# Patient Record
Sex: Male | Born: 1935 | Race: White | Hispanic: No | Marital: Married | Smoking: Former smoker
Health system: Southern US, Community
[De-identification: ages and names within clinical notes are randomized; demographics above are authoritative.]

## PROBLEM LIST (undated history)

## (undated) DIAGNOSIS — N200 Calculus of kidney: Secondary | ICD-10-CM

## (undated) DIAGNOSIS — R011 Cardiac murmur, unspecified: Secondary | ICD-10-CM

## (undated) DIAGNOSIS — C44311 Basal cell carcinoma of skin of nose: Secondary | ICD-10-CM

## (undated) DIAGNOSIS — T782XXA Anaphylactic shock, unspecified, initial encounter: Secondary | ICD-10-CM

## (undated) DIAGNOSIS — E7800 Pure hypercholesterolemia, unspecified: Secondary | ICD-10-CM

## (undated) DIAGNOSIS — E119 Type 2 diabetes mellitus without complications: Secondary | ICD-10-CM

## (undated) DIAGNOSIS — M1990 Unspecified osteoarthritis, unspecified site: Secondary | ICD-10-CM

## (undated) DIAGNOSIS — I10 Essential (primary) hypertension: Secondary | ICD-10-CM

## (undated) HISTORY — PX: SKIN CANCER EXCISION: SHX779

## (undated) HISTORY — PX: BACK SURGERY: SHX140

---

## 1983-05-08 HISTORY — PX: LUMBAR DISC SURGERY: SHX700

## 2001-05-07 HISTORY — PX: COLONOSCOPY: SHX174

## 2001-08-23 ENCOUNTER — Encounter (INDEPENDENT_AMBULATORY_CARE_PROVIDER_SITE_OTHER): Payer: Self-pay | Admitting: *Deleted

## 2001-08-23 ENCOUNTER — Inpatient Hospital Stay (HOSPITAL_COMMUNITY): Admission: EM | Admit: 2001-08-23 | Discharge: 2001-08-26 | Payer: Self-pay | Admitting: Emergency Medicine

## 2001-08-23 ENCOUNTER — Encounter: Payer: Self-pay | Admitting: Emergency Medicine

## 2001-08-25 ENCOUNTER — Encounter: Payer: Self-pay | Admitting: Family Medicine

## 2001-08-28 ENCOUNTER — Encounter: Admission: RE | Admit: 2001-08-28 | Discharge: 2001-08-28 | Payer: Self-pay | Admitting: Family Medicine

## 2001-10-31 ENCOUNTER — Ambulatory Visit (HOSPITAL_COMMUNITY): Admission: RE | Admit: 2001-10-31 | Discharge: 2001-10-31 | Payer: Self-pay | Admitting: Gastroenterology

## 2001-10-31 ENCOUNTER — Encounter (INDEPENDENT_AMBULATORY_CARE_PROVIDER_SITE_OTHER): Payer: Self-pay | Admitting: Specialist

## 2007-12-04 ENCOUNTER — Inpatient Hospital Stay (HOSPITAL_COMMUNITY): Admission: EM | Admit: 2007-12-04 | Discharge: 2007-12-07 | Payer: Self-pay | Admitting: *Deleted

## 2007-12-05 ENCOUNTER — Encounter (INDEPENDENT_AMBULATORY_CARE_PROVIDER_SITE_OTHER): Payer: Self-pay | Admitting: Internal Medicine

## 2010-09-19 NOTE — Discharge Summary (Signed)
Bradley Simmons, BIRCHER NO.:  [id number]   MEDICAL RECORD NO.:  [id number]          PATIENT TYPE:  INP   LOCATION:  4739                         FACILITY:  [facility]   PHYSICIAN:  Elliot Cousin, M.D.    DATE OF BIRTH:  Aug 27, 1929   DATE OF ADMISSION:  12/04/2007  DATE OF DISCHARGE:  12/07/2007                               DISCHARGE SUMMARY   DISCHARGE DIAGNOSES:  1. Fever secondary to Escherichia coli bacteremia.  Source unknown.  2. Mild transient antibiotic-induced diarrhea.  3. Chronic thrombocytopenia dating back to 2003, presumed to be      secondary to alcohol use.  4. Macrocytosis secondary to alcohol use.  5. Hepatic transaminitis, thought to be secondary to alcohol use.      Ultrasound of the abdomen essentially negative for acute findings.  6. Alcohol use/abuse.  The patient stated that he had decreased his      alcohol intake several days prior to this hospitalization.  He      plans to stop completely.  7. Hypertension.  8. Type 2 diabetes mellitus.  9. Hypokalemia.   DISCHARGE MEDICATIONS:  1. Cipro 500 mg b.i.d. for 12 more days.  2. Multivitamin once daily.  3. Zetia 10 mg daily.  4. Aspirin 81 mg daily.  5. Moexipril 15 mg daily.  6. Glipizide 5 mg daily (do not take if your capillary blood glucose      is less than 120 over the next few days).   DISCHARGE DISPOSITION:  The patient is currently stable and in improved  condition.  He is being discharged today.  He was advised to follow up  with Dr. Christell Constant in 3-5 days or as needed.   PROCEDURE PERFORMED:  1. 2-D echocardiogram.  Official final report/results are pending.      The preliminary results revealed no obvious gross vegetations.  2. Chest X-Ray on December 05, 2007.  The results revealed no acute      abnormalities.  3. Ultrasound of the abdomen on December 05, 2007.  The results revealed      no acute findings.  Normal gallbladder.  Possible fatty      infiltration of the liver.  Bilateral  renal cysts.   HISTORY OF PRESENT ILLNESS:  The patient is a 77 year old man with a  past medical history significant for hypertension and diabetes mellitus.  He was transferred to [facility] from [facility]  Emergency Department for further evaluation of fever and gram-negative  rod bacteremia.  Apparently, the patient presented to his primary care  physician Dr. Christell Constant several days prior to admission.  The patient's  temperature was found to be 102.3 in Dr. Kathi Der office.  Blood work was  obtained, and the patient was found to have thrombocytopenia.  Because  of the thrombocytopenia, doxycycline was started as the initial  treatment.  In addition, Dr. Christell Constant ordered blood cultures.  He was  called later by the lab with a report of positive blood cultures for  gram-negative rods.  The patient was, therefore, advised to go to the  emergency department  at [facility].  The patient was transferred  to [facility] as the physician at [facility] reported  that there was no infectious diseases specialist there at that time.   For additional details, please see the dictated history and physical.   HOSPITAL COURSE:  1. E-COLI BACTEREMIA.  At the time of the initial assessment at [facility], the patient was afebrile and hemodynamically stable.      His white blood cell count was 11.5.  During the evaluation at      [facility], the patient was started on Zosyn, vancomycin,      and Zithromax.  A number of laboratory studies were ordered by the      physician at [facility] including an HIV screen, which was      negative and blood cultures which have been negative so far.  His      urinalysis was negative as well.  [facility] spotted fever IgM      and IgG, as well as Lyme disease antibodies were ordered by the      physician at [facility].  However, the results were pending      when the patient was transferred to [facility].  During      this hospital course, the patient had no complaints of chest pain,      shortness of breath, abdominal pain, nausea, vomiting, diarrhea,      painful urination, or any upper respiratory infection symptoms.      Blood cultures were reordered, as well as an urinalysis and chest x-      ray.  The blood cultures have remained negative so far.  His      urinalysis revealed no signs of infection.  His chest x-ray was      completely clear.  The patient was maintained on vancomycin and      Zosyn.  Doxycycline was restarted.  Two days following intravenous      antibiotic treatment, the patient developed loose stools, however,      he had no diarrhea prior to the hospitalization.  Once the      intravenous antibiotics were discontinued, the patient's diarrhea      resolved.  Stool studies were obtained, nevertheless, and so far      they have been negative for C. diff and signs of routine bacterial      infection.  The patient is currently afebrile and has been afebrile      throughout the hospitalization.  His white blood cell count is now      within normal limits at 9.6.  The dictating physician called      [facility] to clarify and confirm whether the gram-negative rod was      growing in his blood.  [facility] confirmed that the bacteria was E-      coli, which was essentially pansensitive.  Therefore, vancomycin,      doxycycline, and Zosyn were discontinued.  The patient was      subsequently started on ciprofloxacin.  I discussed treatment      options with infectious disease's physician, Dr. Daiva Eves after he      was given the patient's clinical scenario.  Dr. Daiva Eves recommended      ciprofloxacin for continued treatment.  Total antibiotic course      should be 14 days.  The patient has received 2-1/2 days of  intravenous antibiotics, and he will be discharged to home on Cipro      for 12 more days.  The blood cultures ordered during this      hospitalization  are currently negative.  However, the final report      is pending.  2. CHRONIC THROMBOCYTOPENIA, MACROCYTOSIS, HEPATIC TRANSAMINITIS,      THOUGHT TO BE SECONDARY TO ALCOHOL USE.  The patient has a history      of chronic thrombocytopenia dating back to 2003, thought to be      secondary to alcohol use/abuse.  His platelet count was 51 at the      time of the initial hospital assessment.  In addition, the patient      had evidence of macrocytosis with an MCV of 104.7.  His total      bilirubin was 3.0, SGOT was 139, and SGPT was 85 at the time of the      initial hospital assessment.  The patient had no complaints of      abdominal pain, nausea, or vomiting.  An ultrasound of the abdomen      was ordered and revealed no acute findings, although there was      possibly mild fatty infiltration of the liver.  Upon my discussion      with the patient, he readily admitted to drinking approximately one      six-pack of beer daily.  However, he had already cut down to 1-2      beers several days prior to his hospital admission.  The patient      stated that he will stop drinking and that he needed no further      help with stopping.  Vitamin B12, TSH, and folate levels were      ordered for further evaluation.  His vitamin B12 was within normal      limits at 589.  His TSH was within normal limits at 4.4.  And his      folate was within normal limits at 8.5.  His followup bilirubin      improved to 1.8, his SGOT improved to 72, and his SGPT improved to      66.  3. HYPERTENSION.  The patient's blood pressure was well controlled      during the hospitalization.  4. TYPE 2 DIABETES MELLITUS.  The patient's diabetes was well      controlled during the hospitalization.  His      hemoglobin A1c was 6.4.  5. HYPOKALEMIA.  The patient's potassium was borderline low at 3.5.      He was repleted with potassium chloride during the hospitalization.      Elliot Cousin, M.D.  Electronically  Signed     DF/MEDQ  D:  12/07/2007  T:  12/08/2007  Job:  [id number]   cc:   Ernestina Penna, M.D.

## 2010-09-19 NOTE — H&P (Signed)
NAMEVERGIL, BURBY                 ACCOUNT NO.:  [id number]   MEDICAL RECORD NO.:  [id number]          PATIENT TYPE:  INP   LOCATION:  4739                         FACILITY:  [facility]   PHYSICIAN:  Mobolaji B. Bakare, M.D.DATE OF BIRTH:  Nov 30, 1929   DATE OF ADMISSION:  12/04/2007  DATE OF DISCHARGE:                              HISTORY & PHYSICAL   PRIMARY CARE PHYSICIAN:  Ernestina Penna, M.D.   CHIEF COMPLAINT:  Fever and chills.   HISTORY OF PRESENTING COMPLAINT:  Mr. Batten is a pleasant 77 year old  Caucasian male with history of diabetes mellitus, hypertension and  hyperlipidemia; who was in his usual state of health until 3 days ago,  when he developed fever associated with chills late in the afternoon.  He went to see Dr. Christell Constant and the patient was felt to be dehydrated.  He  was given some IV fluid and he felt better and was sent home.  About the  same time in the late afternoon the next day he developed fever and  chills.  Temperature on July 27 in Dr. Kathi Der office was 102.3.  He was  given IV fluids and some blood work were drawn at that time.  The next  day the patient had a similar episode of fever and chills.  Again, he  was seen by Dr. Kathi Der office.  He was started on doxycycline 100 mg  b.i.d. for presumed [facility] spotted fever; serologies were also  taken.   Again today the patient had similar episode.  He went back to Dr.  Kathi Der office.  His blood pressures were actually elevated.  He was  slightly tachycardic.  He had a fever of 100.1 with chills.  His blood  culture which was drawn on December 03, 2007 came back showing gram-negative  rods.  The patient was therefore sent to [facility].  The patient  was transferred from [facility] to [facility], due to lack of  infectious disease consultation over the weekend.   The patient denies any cough.  He has no shortness of breath.  There is  no sore throat, body aches, and no headaches.  He denies  dysuria or  straining at micturition.  No foul-smelling urine.  He denies diarrhea.  He had an episode of nausea today associated with vomiting of one  occasion.  He had an episode of nausea and vomiting yesterday, but no  diarrhea and no abdominal pain.  Laboratory data done at [facility] revealed leukopenia, with absolute neutrophil count of 1.6 and  thrombocytopenia of 56.  The patient had no bleeding from his gums or  any notable bruises.  He has received vancomycin, Zosyn and Zithromax at  [facility] prior to transfer.   REVIEW OF SYSTEMS:  As in the HPI.  He denies changes in mental status.  No neck stiffness.  No chest pain.  He has no noticeable rash.  The  patient tells me that he noted a bite about 3 weeks ago, which left him  with some red spots on his left leg -- but this has  since disappeared.  The patient has no history of sick contact.  No recent travels.  He has  no pets at home.  He lives with his wife.   PAST MEDICAL HISTORY:  1. Hypertension.  2. Hyperlipidemia.  3. Diabetes mellitus.  4. History of colon polyp, status post colonoscopy and polypectomy      June 2003 by Dr. Matthias Hughs.  5. History of alcohol abuse, with low platelets in 2003.  6. History of GI bleed in 2003.   PAST SURGICAL HISTORY:  Back surgery.   CURRENT MEDICATIONS:  The patient cannot remember names of his  medications, but  he knows he takes a tablet for high blood pressure,  cholesterol and glipizide for diabetes.   ALLERGIES:  NO KNOWN DRUG ALLERGIES.   FAMILY HISTORY:  Significant for father who passed away at the age of 64  for myocardial infarction.  Mother had some sinus cancer.   SOCIAL HISTORY:  He is retired from an [employer] as a Pensions consultant.  He quit smoking several years ago.  He  occasionally drinks alcohol; tells me he drinks 1-2 beers per night and  occasionally drinks vodka, but denies any withdrawal symptoms.  He is  active and  independent of activities of daily living.   PHYSICAL EXAMINATION:  VITAL SIGNS:  Initial vitals on admission --  Temperature 98.3, pulse 76, respiratory rate 20. blood pressure 110/65,  O2 saturations of 97% on room air.  GENERAL:  On examination the patient is awake, alert, oriented to time,  place and person.  HEENT:  Normocephalic, atraumatic.  Pupils equal, round and reactive to  light.  Extraocular muscles intact.  Mucous membranes moist.  NECK:  Prominent neck veins.  No carotid bruits.  LUNGS:  Clear clinically to auscultation.  CVS:  S1 and S2 regular.  No murmur.  ABDOMEN:  Not distended, soft, and nontender.  Bowel sounds present.  No  palpable organomegaly.  EXTREMITIES:  No pitting pedal edema, no calf  tenderness.  Dorsalis pedis pulses palpable bilaterally.  No distal  cyanosis.  CNS:  No focal neurological deficit.  SKIN:  There was no  papular rash and no evidence of cellulitis.   INITIAL LABORATORY DATA:  Done at Lab Call December 03, 2007 showed:  Sodium  135, potassium 4.8, chloride 99, bicarb 23, calcium 8.7, total protein  5.7, albumin 3.2, total bilirubin 2.5, bilirubin 2.0.  AST 64, ALT 45,  alkaline phosphatase 69.  Lyme disease was negative.  [facility] spotted fever was also  negative.  Blood cultures done on December 03, 2007 showed (aerobic bottle)  gram-negative rods from one bottle, and second brought in no growth in  36-48 hours (anaerobic bottle).   Laboratory data received from [facility] showed:  Sodium 154,  potassium 3.0, chloride 102, bicarb 26, anion gap 9, glucose 119.  Calcium 8.6.  White cells 1.6., hemoglobin 14.4, platelets 40.8, MCV of  102.7.  Platelets 56 (Platelets from blood work done on July 29 were  69).  Morphology shows bands of 39 and absolute neutrophil count of  1600.   HIV serology is negative.  Urinalysis is unremarkable, except for  protein 30.   Chest x-ray shows no acute disease.  CT scan of the chest shows no  acute  abnormality.   ASSESSMENT AND PLAN:  Mr. Hoecker is a 77 year old Caucasian male with  history of diabetes mellitus, hypertension and lipidemia, who started  having fever associated with chills about  3 days ago.  He had a positive  blood culture showing gram-negative rods on aerobic bottle drawn on December 03, 2007.  The patient has leukopenia with absolute neutrophil count of  1.6 and thrombocytopenia.  His baseline platelet count is unknown.  In  2003 he had history of low platelets (platelets there were 125,000).  The patient will be admitted for further evaluation and treatment.   ADMISSION DIAGNOSES:  1. Gram-negative bacteremia of unclear source.  Will repeat blood      cultures, urine culture and check ultrasound of the gallbladder.      We will pursue final blood culture identification from Dr. Kathi Der      or [employer].  Will continue Zosyn, vancomycin until final      identification is available.  Will consult ID in the morning.  2. Thrombocytopenia and leukopenia.  Probably related to gram-negative      bacteremia versus underlying hematological problems.  Will ask      hematology to see him, and obtain recent blood work from Dr.      Kathi Der office.  We will continue doxycycline for Rickettsial      disease.  Will check [facility] spotted fever, Ehrlichia      chaffeensis, , IgG and IgM.  3. Macrocytosis.  Check vitamin B12 and folate.  Hemoglobin and      hematocrit are normal.  4. Mild hyponatremia.  Will give IV fluid normal saline.  5. Hypokalemia.  Will replete with potassium chloride.  6. Diabetes mellitus.  The patient was on glipizide.  Will give him      Lantus during this acute illness, more so that he cannot recall the      dose of his glipizide.  We will start Lantus 8 units subcutaneously      daily and cover with sliding-scale insulin.  Check hemoglobin A1c.  7. Hypertension.  Blood pressure is currently controlled.  Will hold      antihypertensives for  now.  8. Hyperlipidemia.  Resume home medications when dosages available.  9. DVT prophylaxis.  Will use sequential compression devices for DVT      prophylaxis.  Will avoid heparin products at this time.  10.Alcohol abuse.  We will use Ativan p.r.n. if he develops withdrawal      symptoms.  We will place the patient on thiamine, folic acid and      multivitamin.      Mobolaji B. Corky Downs, M.D.  Electronically Signed     MBB/MEDQ  D:  12/05/2007  T:  12/05/2007  Job:  [id number]   cc:   Ernestina Penna, M.D.

## 2010-09-22 NOTE — Procedures (Signed)
[place]  Patient:    Bradley Simmons, Bradley Simmons Visit Number: [id number] MRN: [id number]          Service Type: MED Location: [phone] Attending Physician:  Doneta Public Dictated by:   Florencia Reasons, M.D. Proc. Date: 08/25/01 Admit Date:  08/23/2001   CC:         Monica Becton, M.D.   Procedure Report  PROCEDURE PERFORMED:  Colonoscopy with polypectomy and control of hemorrhage.  ENDOSCOPIST:  Florencia Reasons, M.D.  INDICATIONS FOR PROCEDURE:  The patient is a 77 year old admitted to the hospital yesterday with a one day history of recurrent painless hematochezia. His hemoglobin has dropped about 4 gm while in-house.  FINDINGS:  Bleeding pedunculated polyp near the splenic flexure.  Multiple other polyps identified but not removed.  Hemostasis achieved.  DESCRIPTION OF PROCEDURE:  The nature, purpose and risks of the procedure had been discussed with the patient, who provided written consent.  Sedation was Demerol 100 mg and Versed 7.5 mg IV without arrhythmias or desaturation. Digital exam was unremarkable.  The prostate was not well felt.  The Olympus adult video colonoscope was advanced with some looping around the colon to the cecum and pullback was then performed.  There was a large amount of fresh red blood in most of the colonic lumen up to the region of the  hepatic flexure, proximal to which there was brown colored stool residue but no blood.  Active bleeding characterized by continuous flow oozing of fresh red blood, was occurring from a polypoid lesion near the splenic flexure.  This appeared to be a pedunculated polyp where the head of the polyp had pretty much autoamputated and all we were seeing was what appeared to be the stalk  and the base of the polyp head.  Originally, there was a fairly large adherent clot associated with this lesion but after irrigation, the clot broke loose and we were able to  directly visualize the active oozing.  The base of the stalk was injected with a couple of ccs of 1:100,000 epinephrine, resulting in complete hemostasis, and then the snare was used to snare off the stalk, using cautery with a gradual pull-through.  The stalk was retrieved by suctioning through the scope so it could be sent for histologic analysis.  The base of the polypectomy site had a good eschar with absolutely no bleeding and no evidence of excessive cautery.  To help secure hemostasis, I injected further epinephrine, another cc or so in the mucosa near the base of the polypectomy site and then the adjacent mucosa was injected with [place] ink to mark the spot for future localization in case this should happen to be a malignant polyp.  We also obtained a KUB demonstrating the tip of the scope at the site of the polypectomy to be right at the splenic flexure of the colon.  On this examination, I encountered at least two additional medium-sized pedunculated polyps in the left colon, a semipedunculated 5 to 8 mm polyp near the polypectomy site, and perhaps one or two smaller polyps in the ascending colon.  However, I elected not remove any of these polyps in view of the fact that the patient has had recent lower GI bleeding and I did not want to confuse the issue of possible postpolypectomy hemorrhage with the GI bleeding with which the patient had presented.  No colitis, diverticulosis or masses were observed during this exam, and the patient tolerated  it well and without any apparent complication.  IMPRESSION: 1. Active bleeding from a pedunculated lesion, presumably a necrotic    autoamputated polyp near the splenic flexure. 2. Large amount of blood present in the colon distal to this lesion. 3. Hemostasis achieved after injection of epinephrine. 4. Snare technique with cautery used to remove the polyp remnant which    has been sent for histologic analysis. 5. Site of polyp  confirmed by x-ray and marked with [place] ink tattoo. 6. Additional significant polyps identified during this exam but not removed    as described above. 7. No alternative source of bleeding such as diverticular disease, colitis    or masses noted.  PLAN: 1. Await pathology on the polyp remnant removed. 2. The patient will need elective repeat colonoscopy in one to two months to    remove any remaining polyps.  Note that the presence of blood in the distal    colon may have obscured lesions as well. Consider follow-up colonoscopy in five years in view of the family history of colon cancer.Dictated by:   Florencia Reasons, M.D. Attending Physician:  Doneta Public DD:  08/25/01 TD:  08/25/01 Job: [id number]

## 2010-09-22 NOTE — Consult Note (Signed)
[place]. [facility]  Patient:    NUR, KRASINSKI Visit Number: [id number] MRN: [id number]          Service Type: MED Location: [id number] Attending Physician:  Doneta Public Dictated by:   Florencia Reasons, M.D. Proc. Date: 08/24/01 Admit Date:  08/23/2001   CC:         Vernon Prey, M.D. in [place]   Consultation Report  CONTINUATION  IMPRESSION: 5. Moderately heavy ethanol use which might account for the patients    thrombocytopenia.  PLAN: 1. Proceed a colonoscopic evaluation tomorrow following a prep tonight. The    nature, purpose, risks, and alternatives of colonoscopy were reviewed. Not    only will it confirm the presence or absence of diverticular disease as    well as possible other causes of such bleeding (vascular ectasia, less    likely neoplasia) but also it will serve as a screening test for colon    cancer. 2. On careful review of the patients case, I do not see the need for    concurrent upper endoscopy since he had a normal BUN, no orthostasis, etc. 3. If the patient were to develop brisk fresh bleeding tonight, I might    consider doing a rigid proctoscopy to look for a rectal outlet source of    bleeding.  I appreciate the opportunity to have seen this patient in consultation. Dictated by:   Florencia Reasons, M.D. Attending Physician:  Doneta Public DD:  08/24/01 TD:  08/25/01 Job: [id number]

## 2010-09-22 NOTE — Procedures (Signed)
[place]. [facility]  Patient:    Bradley Simmons, Bradley Simmons Visit Number: [id number] MRN: [id number]          Service Type: END Location: ENDO Attending Physician:  Rich Brave Dictated by:   Florencia Reasons, M.D. Proc. Date: 10/31/01 Admit Date:  10/31/2001 Discharge Date: 10/31/2001   CC:         Monica Becton, M.D.   Procedure Report  PROCEDURE:  Colonoscopy with polypectomy.  SURGEON:  Florencia Reasons, M.D.  INDICATIONS:  A 77 year old gentleman who is approximately two months status post a lower GI bleed which turned out to be due to an auto-amputated pedunculated polyp.  The remnant of the polyp was snared off at that time and hemostasis was achieved; pathology showed hyperplastic changes.  Incidentally, at the time of that procedure, however, the patient was noted to have multiple other polyps present, and it was elected not to remove them at that time, so as to not potentially confuse the issue regarding bleeding.  FINDINGS:  Approximately seven polyps removed, the largest being about 8 mm across.  DESCRIPTION OF PROCEDURE:  The nature, purpose, and risks of the procedure had been reviewed with the patient previously for his exam, and he provided written consent.  Sedation was fentanyl 75 mcg and Versed 8 mg IV without arrhythmias or desaturation.  Digital exam of the prostate was normal.  The Olympus adult video colonoscope was advanced without much difficulty to the cecum as identified by visualization of the appendiceal orifice.  In the cecum and proximal ascending colon, there were a couple of diminutive 2-3 mm sessile polyps removed by cold biopsy technique.  In the transverse colon, there were two or three pedunculated or semi-pedunculated polyps removed by snare technique.  In the left colon, there were approximately three medium-sized pedunculated polyps up to 8 mm across, removed by snare technique.  In all cases of  the snare polypectomies, there was complete hemostasis, and no evidence of excessive cautery.  No diverticulosis, cancer, vascular malformations, or colitis were observed.  Retroflexion of the rectum was unremarkable as was reinspection of the rectosigmoid.  IMPRESSION:  Multiple small medium-sized polyps removed as described above.  PLAN:  Await pathology results.  In view of the large number of polyps, relatively early follow up would be warranted. Dictated by:   Florencia Reasons, M.D. Attending Physician:  Rich Brave DD:  10/31/01 TD:  11/03/01 Job: [id number]

## 2010-09-22 NOTE — Consult Note (Signed)
[place]. [facility]  Patient:    Bradley Simmons, Bradley Simmons Visit Number: [id number] MRN: [id number]          Service Type: MED Location: 3000 [id number] Attending Physician:  Doneta Public Dictated by:   Florencia Reasons, M.D. Proc. Date: 08/24/01 Admit Date:  08/23/2001   CC:         Vernon Prey, M.D. [place]   Consultation Report  INCOMPLETE  REASON FOR CONSULTATION:  The family practice teaching service asked me to see this 77 year old gentleman because of GI bleeding of 24 hours duration.  HISTORY OF PRESENT ILLNESS:  The patient was admitted to the hospital last night, after beginning to pass blood per rectum yesterday morning around 9 a.m., without any associated abdominal pain, perianal pain, or orthostatic symptomatology, and without any worrisome risk factors for ulcer disease such as aspirin or NSAID exposure, nor any prior history of GI bleeding or ulcer disease.  On admission, his hemoglobin was 16 and it has dropped to 13 with hydration. The patients BUN has been under 10 on both occasions when it has been checked.  The patients bleeding, which is described as both dark blood and also some bright red blood, has occurred in the absence of defecation. He has had a lot of rolling, gurgling, and gas in his abdomen. The bleeding seems to have slowed down; his last bowel movement was just a small spot of blood.  PAST MEDICAL HISTORY:  ALLERGIES:  None known.  OUTPATIENT MEDICATIONS:  Unknown. He takes a small pill for his type 2 diabetes and also a blood pressure pill, and does use some Tylenol.  PAST SURGICAL HISTORY:  No abdominal surgery. He has had what sounds like a lumbar laminectomy in the past.  MEDICAL ILLNESSES:  He has a several year history of type 2 diabetes, hypertension, and DJD. No known cardiopulmonary disease.  HABITS:  Nonsmoker, moderately heavy ethanol (several glasses of vodka most days out of  the week).  FAMILY HISTORY:  Negative for GI tract illnesses such as colon cancer, diverticulosis, liver disease, or ulcers.  SOCIAL HISTORY:  The patient has been married to his wife, Claris Che, for the past 48 years and he is a veteran of the [place] [employer].  REVIEW OF SYSTEMS:  He has very rare episodes of transient dysphagia without any history of prolonged food impactions. Maybe occasional heartburn but basically this is not a problem for him. No stomach pain.  PHYSICAL EXAMINATION:  GENERAL:  This is a pleasant stocky healthy-appearing gentleman in no evident distress, appearing neither anxious nor depressed.  VITAL SIGNS:  Blood pressure 162/80, pulse 64, respirations 20, afebrile.  HEENT:  Anicteric, no evident pallor.  CHEST:  Clear to auscultation anterolaterally.  HEART:  No gallops, rubs, murmurs, clicks, or arrhythmias.  ABDOMEN:  Active bowel sounds. No organomegaly, normal liver span by scratch test, no guarding, mass, or tenderness.  RECTAL:  No evident perianal pathology. Normal sphincter tone. Unremarkable prostate. Empty rectal ampulla with a small amount of medium red sanguineous coating of the glove.  LABORATORY DATA:  Admission hemoglobin 16.4 with MCV of 100, platelets of 125,000. Platelets have dropped slightly since admission to 112,000. BUN 11 on admission now 9, albumin 3.9. Liver chemistries within normal limits.  IMPRESSION: 1. Nondestabilizing lower gastrointestinal bleed. Doubt upper tract source in    view of absence of risk factors, normal BUN, absence of orthostasis. Most    likely cause would be diverticular bleeding;  conceivably, a rectal outlet    source could present in this fashion, such as an anal fissure or an    internal hemorrhoid. The fact that he has had a lot of "rolling" in his    stomach and a lot of gas would suggest that there is an intestinal source,    and I tend to think it is probably fairly distal in location given the  fact    that there has been quite a bit of blood without significant anemia or    orthostatic symptomatology. 2. Type 2 diabetes. 3. Hypertension. 4. Mild thrombocytopenia. 5. History of moderately heavy ethanol for which the ______ Dictated by:   Florencia Reasons, M.D. Attending Physician:  Doneta Public DD:  08/24/01 TD:  08/25/01 Job: [phone] [id number]

## 2010-09-22 NOTE — Discharge Summary (Signed)
[place]. [facility]  Patient:    Bradley Simmons, Bradley Simmons Visit Number: [id number] MRN: [id number]          Service Type: MED Location: 3000 [id number] Attending Physician:  Doneta Public Dictated by:   Ellwood Handler, M.D. Admit Date:  08/23/2001 Discharge Date: 08/26/2001   CC:         Dr. Christell Constant at [facility].   Discharge Summary  DISCHARGE DIAGNOSES: 1. Rectal bleeding secondary to colonic polyps. 2. Diabetes mellitus. 3. Hypertension.  DISCHARGE MEDICATIONS: 1. Uniretic 12.5 mg p.o. q.d. 2. Glucotrol XL 2.5 mg p.o. q.d.  CONSULTS:  Gastroenterology, Dr. Katy Fitch. Bucc[place].  PROCEDURES: 1. Acute abdominal series on August 23, 2001, negative. 2. Colonoscopy on August 25, 2001 revealed:    A. Active bleeding from necrotic, pedunculated polyp at splenic flexure.    B. Several additional polyps present, however, not removed secondary to       bleeding.    C. No masses.  No diverticulitis.  No colitis present.  HISTORY OF PRESENT ILLNESS:  Please see full H&P for details.  In brief, the patient is a 77 year old white male with a past medical history significant for hypertension and type 2 diabetes, who presented to the [facility] Department complaining of painless rectal bleeding for one day.  The patient reported approximately eight episodes of bright red blood from his rectum.  He denied any abdominal, epigastric, or rectal pain.  LABORATORY DATA:  His labs on admission included WBC of 7.4, hemoglobin 16.4, hematocrit 47.0, platelets 125.  Sodium 137, potassium 3.5, chloride 103, bicarb 26, BUN 11, creatinine 1.0, glucose 161, total protein 7.0, albumin 3.9, alkaline phosphatase 76, AST 42, ALT 40, total bilirubin 1.1.  PT 14.0, PTT 34, INR of 1.1.  He was admitted for further evaluation and treatment.  HOSPITAL COURSE: #1 - Rectal bleeding.  The patient was monitored closely.  A GI consult was obtained and the  patient underwent a colonoscopy on August 25, 2001 with results as noted above.  He tolerated the procedure well without complications.  After the colonoscopy he was monitored overnight.  On the day of discharge he denied further bright red blood per rectum.  His hemoglobin and hematocrit had stabilized to 12.4 and 35.5, respectively.  He is to follow up with Dr. Matthias Hughs in one to two months for an elective colonoscopy to remove the remaining polyps.  #2 - Diabetes mellitus type 2.  His diabetes remained stable throughout hospitalization.  His CBGs were followed closely.  He was continued on Glucotrol XL 2.5 mg q.d.  #3 - Hypertension.  His blood pressure remained stable during his hospitalization.  The patient was treated with Lopressor 12.5 mg twice a day during the course of his hospitalization.  #4 - Social.  A social work consult was obtained during this hospitalization to provide the patient with resources and treatment options for his alcohol use.  DISCHARGE:  The patient was discharged to home in stable condition.  A followup appointment with Dr. Christell Constant was scheduled for September 02, 2001 at 8:30 a.m.  A followup appointment with Dr. Matthias Hughs will be scheduled for one to two months. Dictated by:   Ellwood Handler, M.D. Attending Physician:  Doneta Public DD:  08/26/01 TD:  08/26/01 Job: [id number]

## 2011-02-02 LAB — CULTURE, BLOOD (ROUTINE X 2): Culture: NO GROWTH

## 2011-02-02 LAB — URINALYSIS, ROUTINE W REFLEX MICROSCOPIC
Glucose, UA: 250 — AB
Leukocytes, UA: NEGATIVE
Nitrite: NEGATIVE
Protein, ur: 30 — AB
Urobilinogen, UA: 2 — ABNORMAL HIGH

## 2011-02-02 LAB — DIFFERENTIAL
Eosinophils Relative: 0
Lymphs Abs: 0.3 — ABNORMAL LOW
Monocytes Absolute: 0.8
WBC Morphology: INCREASED

## 2011-02-02 LAB — CBC
Hemoglobin: 14.2
MCHC: 34.7
MCV: 102.7 — ABNORMAL HIGH
Platelets: 65 — ABNORMAL LOW
RBC: 3.95 — ABNORMAL LOW
WBC: 11.5 — ABNORMAL HIGH

## 2011-02-02 LAB — CLOSTRIDIUM DIFFICILE EIA: C difficile Toxins A+B, EIA: NEGATIVE

## 2011-02-02 LAB — GIARDIA/CRYPTOSPORIDIUM SCREEN(EIA): Giardia Screen - EIA: NEGATIVE

## 2011-02-02 LAB — COMPREHENSIVE METABOLIC PANEL
ALT: 85 — ABNORMAL HIGH
AST: 72 — ABNORMAL HIGH
Albumin: 2.5 — ABNORMAL LOW
Alkaline Phosphatase: 75
BUN: 14
CO2: 25
CO2: 24
Calcium: 8.4
Chloride: 101
Creatinine, Ser: 1
GFR calc Af Amer: >60
GFR calc non Af Amer: 58 — ABNORMAL LOW
GFR calc non Af Amer: >60
Glucose, Bld: 146 — ABNORMAL HIGH
Potassium: 3.9
Sodium: 138
Total Bilirubin: 3 — ABNORMAL HIGH
Total Protein: 5.4 — ABNORMAL LOW

## 2011-02-02 LAB — URINE CULTURE
Colony Count: NO GROWTH
Culture: NO GROWTH

## 2011-02-02 LAB — URINE MICROSCOPIC-ADD ON

## 2011-02-02 LAB — STOOL CULTURE

## 2011-02-02 LAB — HEMOGLOBIN A1C: Hgb A1c MFr Bld: 6.4 — ABNORMAL HIGH

## 2011-02-02 LAB — ROCKY MTN SPOTTED FVR AB, IGM-BLOOD: RMSF IgM: 0.74 IV

## 2011-02-02 LAB — EHRLICHIA ANTIBODY PANEL: E chaffeensis (HGE) Ab, IgG: <1:64 {titer}

## 2011-02-02 LAB — VITAMIN B12: Vitamin B-12: 589 (ref 211–911)

## 2011-05-08 HISTORY — PX: CATARACT EXTRACTION W/ INTRAOCULAR LENS IMPLANT: SHX1309

## 2012-07-05 HISTORY — PX: COLONOSCOPY: SHX174

## 2012-07-09 ENCOUNTER — Other Ambulatory Visit: Payer: Self-pay | Admitting: Gastroenterology

## 2012-09-20 ENCOUNTER — Other Ambulatory Visit: Payer: Self-pay | Admitting: Nurse Practitioner

## 2012-09-28 ENCOUNTER — Other Ambulatory Visit: Payer: Self-pay | Admitting: Nurse Practitioner

## 2012-10-20 ENCOUNTER — Other Ambulatory Visit: Payer: Self-pay | Admitting: Nurse Practitioner

## 2012-10-27 ENCOUNTER — Encounter (HOSPITAL_COMMUNITY): Payer: Self-pay | Admitting: Emergency Medicine

## 2012-10-27 ENCOUNTER — Observation Stay (HOSPITAL_COMMUNITY)
Admission: EM | Admit: 2012-10-27 | Discharge: 2012-10-28 | Disposition: A | Discharge status: Home / Self Care | Payer: Medicare Other | Attending: Internal Medicine | Admitting: Internal Medicine

## 2012-10-27 ENCOUNTER — Emergency Department (HOSPITAL_COMMUNITY): Payer: Medicare Other

## 2012-10-27 DIAGNOSIS — K625 Hemorrhage of anus and rectum: Secondary | ICD-10-CM | POA: Insufficient documentation

## 2012-10-27 DIAGNOSIS — R51 Headache: Secondary | ICD-10-CM | POA: Insufficient documentation

## 2012-10-27 DIAGNOSIS — G319 Degenerative disease of nervous system, unspecified: Secondary | ICD-10-CM | POA: Insufficient documentation

## 2012-10-27 DIAGNOSIS — I10 Essential (primary) hypertension: Secondary | ICD-10-CM | POA: Diagnosis present

## 2012-10-27 DIAGNOSIS — D696 Thrombocytopenia, unspecified: Secondary | ICD-10-CM | POA: Insufficient documentation

## 2012-10-27 DIAGNOSIS — I959 Hypotension, unspecified: Secondary | ICD-10-CM | POA: Diagnosis present

## 2012-10-27 DIAGNOSIS — T782XXA Anaphylactic shock, unspecified, initial encounter: Secondary | ICD-10-CM

## 2012-10-27 DIAGNOSIS — R7989 Other specified abnormal findings of blood chemistry: Secondary | ICD-10-CM | POA: Diagnosis present

## 2012-10-27 DIAGNOSIS — R55 Syncope and collapse: Secondary | ICD-10-CM

## 2012-10-27 DIAGNOSIS — E119 Type 2 diabetes mellitus without complications: Secondary | ICD-10-CM | POA: Diagnosis present

## 2012-10-27 DIAGNOSIS — E7800 Pure hypercholesterolemia, unspecified: Secondary | ICD-10-CM | POA: Insufficient documentation

## 2012-10-27 DIAGNOSIS — D72829 Elevated white blood cell count, unspecified: Secondary | ICD-10-CM | POA: Diagnosis present

## 2012-10-27 DIAGNOSIS — J3489 Other specified disorders of nose and nasal sinuses: Secondary | ICD-10-CM | POA: Insufficient documentation

## 2012-10-27 DIAGNOSIS — R404 Transient alteration of awareness: Principal | ICD-10-CM | POA: Insufficient documentation

## 2012-10-27 HISTORY — DX: Anaphylactic shock, unspecified, initial encounter: T78.2XXA

## 2012-10-27 HISTORY — DX: Essential (primary) hypertension: I10

## 2012-10-27 HISTORY — DX: Unspecified osteoarthritis, unspecified site: M19.90

## 2012-10-27 HISTORY — DX: Pure hypercholesterolemia, unspecified: E78.00

## 2012-10-27 HISTORY — DX: Basal cell carcinoma of skin of nose: C44.311

## 2012-10-27 HISTORY — DX: Calculus of kidney: N20.0

## 2012-10-27 HISTORY — DX: Cardiac murmur, unspecified: R01.1

## 2012-10-27 HISTORY — DX: Type 2 diabetes mellitus without complications: E11.9

## 2012-10-27 LAB — CBC WITH DIFFERENTIAL/PLATELET
Eosinophils Relative: 0 % (ref 0–5)
Lymphocytes Relative: 3 % — ABNORMAL LOW (ref 12–46)
Lymphs Abs: 0.6 10*3/uL — ABNORMAL LOW (ref 0.7–4.0)
MCV: 97.4 fL (ref 78.0–100.0)
Neutro Abs: 15.9 10*3/uL — ABNORMAL HIGH (ref 1.7–7.7)
Platelets: 73 10*3/uL — ABNORMAL LOW (ref 150–400)
RBC: 4.97 MIL/uL (ref 4.22–5.81)
WBC: 18.5 10*3/uL — ABNORMAL HIGH (ref 4.0–10.5)

## 2012-10-27 LAB — GLUCOSE, CAPILLARY

## 2012-10-27 LAB — CREATININE, SERUM
Creatinine, Ser: 1.17 mg/dL (ref 0.50–1.35)
GFR calc non Af Amer: 57 mL/min — ABNORMAL LOW (ref >90)

## 2012-10-27 LAB — POCT I-STAT TROPONIN I: Troponin i, poc: 0.09 ng/mL (ref 0.00–0.08)

## 2012-10-27 LAB — CBC
MCHC: 35.6 g/dL (ref 30.0–36.0)
RDW: 12.5 % (ref 11.5–15.5)

## 2012-10-27 LAB — POCT I-STAT, CHEM 8
BUN: 15 mg/dL (ref 6–23)
Chloride: 108 mEq/L (ref 96–112)
Creatinine, Ser: 1.3 mg/dL (ref 0.50–1.35)
Potassium: 4.3 mEq/L (ref 3.5–5.1)
Sodium: 142 mEq/L (ref 135–145)
TCO2: 20 mmol/L (ref 0–100)

## 2012-10-27 LAB — TROPONIN I: Troponin I: <0.3 ng/mL (ref <0.30)

## 2012-10-27 LAB — OCCULT BLOOD, POC DEVICE: Fecal Occult Bld: POSITIVE — AB

## 2012-10-27 MED ORDER — ONDANSETRON HCL 4 MG PO TABS: 4.0000 mg | ORAL_TABLET | Freq: Four times a day (QID) | ORAL | Status: DC | PRN

## 2012-10-27 MED ORDER — ADULT MULTIVITAMIN W/MINERALS CH
1.0000 | ORAL_TABLET | Freq: Every day | ORAL | Status: DC
  Administered 2012-10-28: 1 via ORAL
  Filled 2012-10-27: qty 1

## 2012-10-27 MED ORDER — SODIUM CHLORIDE 0.9 % IJ SOLN: 3.0000 mL | Freq: Two times a day (BID) | INTRAMUSCULAR | Status: DC

## 2012-10-27 MED ORDER — FAMOTIDINE IN NACL 20-0.9 MG/50ML-% IV SOLN
20.0000 mg | Freq: Two times a day (BID) | INTRAVENOUS | Status: DC
  Administered 2012-10-27 – 2012-10-28 (×2): 20 mg via INTRAVENOUS
  Filled 2012-10-27 (×3): qty 50

## 2012-10-27 MED ORDER — SODIUM CHLORIDE 0.9 % IV SOLN: INTRAVENOUS | Status: DC

## 2012-10-27 MED ORDER — OMEGA-3-ACID ETHYL ESTERS 1 G PO CAPS
1.0000 g | ORAL_CAPSULE | Freq: Every day | ORAL | Status: DC
  Administered 2012-10-28: 1 g via ORAL
  Filled 2012-10-27: qty 1

## 2012-10-27 MED ORDER — ASPIRIN EC 81 MG PO TBEC
81.0000 mg | DELAYED_RELEASE_TABLET | Freq: Every day | ORAL | Status: DC
  Administered 2012-10-28: 81 mg via ORAL
  Filled 2012-10-27: qty 1

## 2012-10-27 MED ORDER — ACETAMINOPHEN 650 MG RE SUPP: 650.0000 mg | Freq: Four times a day (QID) | RECTAL | Status: DC | PRN

## 2012-10-27 MED ORDER — METHYLPREDNISOLONE SODIUM SUCC 125 MG IJ SOLR
80.0000 mg | Freq: Three times a day (TID) | INTRAMUSCULAR | Status: DC
  Administered 2012-10-27 – 2012-10-28 (×3): 80 mg via INTRAVENOUS
  Filled 2012-10-27: qty 2
  Filled 2012-10-27 (×6): qty 1.28

## 2012-10-27 MED ORDER — METHYLPREDNISOLONE SODIUM SUCC 125 MG IJ SOLR: 125.0000 mg | Freq: Once | INTRAMUSCULAR | Status: DC

## 2012-10-27 MED ORDER — INSULIN ASPART 100 UNIT/ML ~~LOC~~ SOLN
0.0000 [IU] | Freq: Three times a day (TID) | SUBCUTANEOUS | Status: DC
  Administered 2012-10-27: 7 [IU] via SUBCUTANEOUS
  Administered 2012-10-28 (×3): 5 [IU] via SUBCUTANEOUS

## 2012-10-27 MED ORDER — ACETAMINOPHEN 325 MG PO TABS
650.0000 mg | ORAL_TABLET | Freq: Four times a day (QID) | ORAL | Status: DC | PRN
Start: 2012-10-27 — End: 2012-10-28

## 2012-10-27 MED ORDER — ONDANSETRON HCL 4 MG/2ML IJ SOLN: 4.0000 mg | Freq: Three times a day (TID) | INTRAMUSCULAR | Status: DC | PRN

## 2012-10-27 MED ORDER — SENNOSIDES-DOCUSATE SODIUM 8.6-50 MG PO TABS
1.0000 | ORAL_TABLET | Freq: Every evening | ORAL | Status: DC | PRN
  Filled 2012-10-27: qty 1

## 2012-10-27 MED ORDER — SODIUM CHLORIDE 0.9 % IV SOLN
INTRAVENOUS | Status: DC
  Administered 2012-10-27: 18:00:00 via INTRAVENOUS

## 2012-10-27 MED ORDER — ATORVASTATIN CALCIUM 10 MG PO TABS
10.0000 mg | ORAL_TABLET | Freq: Every day | ORAL | Status: DC
  Administered 2012-10-28: 10 mg via ORAL
  Filled 2012-10-27: qty 1

## 2012-10-27 MED ORDER — ONDANSETRON HCL 4 MG/2ML IJ SOLN: 4.0000 mg | Freq: Four times a day (QID) | INTRAMUSCULAR | Status: DC | PRN

## 2012-10-27 MED ORDER — ASPIRIN 81 MG PO CHEW
324.0000 mg | CHEWABLE_TABLET | Freq: Once | ORAL | Status: AC
  Administered 2012-10-27: 324 mg via ORAL
  Filled 2012-10-27: qty 4

## 2012-10-27 MED ORDER — ONDANSETRON HCL 4 MG/2ML IJ SOLN
INTRAMUSCULAR | Status: AC
  Administered 2012-10-27: 4 mg
  Filled 2012-10-27: qty 2

## 2012-10-27 MED ORDER — DIPHENHYDRAMINE HCL 50 MG/ML IJ SOLN
12.5000 mg | Freq: Three times a day (TID) | INTRAMUSCULAR | Status: DC
  Administered 2012-10-27 – 2012-10-28 (×3): 12.5 mg via INTRAVENOUS
  Filled 2012-10-27: qty 0.25
  Filled 2012-10-27 (×2): qty 1
  Filled 2012-10-27 (×2): qty 0.25

## 2012-10-27 MED ORDER — GLIPIZIDE ER 5 MG PO TB24
5.0000 mg | ORAL_TABLET | Freq: Every day | ORAL | Status: DC
  Administered 2012-10-28: 5 mg via ORAL
  Filled 2012-10-27 (×3): qty 1

## 2012-10-27 MED ORDER — ENOXAPARIN SODIUM 40 MG/0.4ML ~~LOC~~ SOLN
40.0000 mg | SUBCUTANEOUS | Status: DC
  Administered 2012-10-27: 40 mg via SUBCUTANEOUS
  Filled 2012-10-27 (×3): qty 0.4

## 2012-10-27 NOTE — ED Notes (Signed)
Pt up to bsc x 2 has had diarrhea x 2 and vomited x1 while up on potty

## 2012-10-27 NOTE — ED Provider Notes (Signed)
History     CSN: [id number]  Arrival date & time 10/27/12  1129   First MD Initiated Contact with Patient 10/27/12 1139      Chief Complaint  Patient presents with  . Loss of Consciousness  . Hypotension    (Consider location/radiation/quality/duration/timing/severity/associated sxs/prior treatment) HPI Comments: The patient is an 79 year old male with a history of diabetes and high cholesterol who presents by ambulance after he was in his garden plowing, his wife found him unresponsive and called the paramedics. The patient reports that he was bitten on his leg several times by wasps, between 5 and 10 minutes later he had to sit down secondary to weakness and then passed out and became unresponsive. The next thing he remembers, the paramedics were standing over him. When the paramedics found the patient he was purple, leaning over with his face in the dirt, head turned to the side with an obstructed airway. When they rolled him onto his back and assisted his ventilations he eventually was able to breathe, then became responsive and was able answer questions. The patient denies chest pain or shortness of breath but states that he feels groggy. He was given 1000 cc of normal saline, 50 mg of Benadryl, 125 mg of Solu-Medrol. The patient has never had a significant allergic reaction to insect stings in the past. The symptoms were acute in onset, persistent, severe but have resolved spontaneously with manipulation of the airway.  Patient is a 79 y.o. male presenting with syncope. The history is provided by the patient, the spouse and the EMS personnel.  Loss of Consciousness   Past Medical History  Diagnosis Date  . Diabetes mellitus without complication     No past surgical history on file.  No family history on file.  History  Substance Use Topics  . Smoking status: Never Smoker   . Smokeless tobacco: Not on file  . Alcohol Use: Yes      Review of Systems  Cardiovascular:  Positive for syncope.  All other systems reviewed and are negative.    Allergies  Review of patient's allergies indicates no known allergies.  Home Medications   Current Outpatient Rx  Name  Route  Sig  Dispense  Refill  . amLODipine (NORVASC) 5 MG tablet   Oral   Take 5 mg by mouth daily.         Marland Kitchen aspirin 81 MG chewable tablet   Oral   Chew 81 mg by mouth daily.         Marland Kitchen atorvastatin (LIPITOR) 10 MG tablet      TAKE ONE TABLET BY MOUTH ONE TIME DAILY   30 tablet   1   . GLIPIZIDE XL 5 MG 24 hr tablet      TAKE ONE TABLET BY MOUTH ONE TIME DAILY   30 tablet   0   . lisinopril (PRINIVIL,ZESTRIL) 20 MG tablet   Oral   Take 20 mg by mouth daily.         . metFORMIN (GLUCOPHAGE) 1000 MG tablet   Oral   Take 1,000 mg by mouth daily with breakfast.         . Multiple Vitamin (MULTIVITAMIN WITH MINERALS) TABS   Oral   Take 1 tablet by mouth daily.         Marland Kitchen omega-3 acid ethyl esters (LOVAZA) 1 G capsule   Oral   Take 1 g by mouth daily.           BP  132/67  Pulse 82  Temp(Src) 97.4 F (36.3 C)  Resp 20  SpO2 96%  Physical Exam  Nursing note and vitals reviewed. Constitutional: He appears well-developed and well-nourished. No distress.  HENT:  Head: Normocephalic and atraumatic.  Mouth/Throat: Oropharynx is clear and moist. No oropharyngeal exudate.  Eyes: Conjunctivae and EOM are normal. Pupils are equal, round, and reactive to light. Right eye exhibits no discharge. Left eye exhibits no discharge. No scleral icterus.  Neck: Normal range of motion. Neck supple. No JVD present. No thyromegaly present.  Cardiovascular: Normal rate, regular rhythm, normal heart sounds and intact distal pulses.  Exam reveals no gallop and no friction rub.   No murmur heard. Pulmonary/Chest: Effort normal and breath sounds normal. No respiratory distress. He has no wheezes. He has no rales.  Abdominal: Soft. Bowel sounds are normal. He exhibits no distension and  no mass. There is no tenderness.  Musculoskeletal: Normal range of motion. He exhibits no edema and no tenderness.  Lymphadenopathy:    He has no cervical adenopathy.  Neurological: He is alert. Coordination normal.  Skin: Skin is warm and dry. Rash ( Slight erythema around the right lower extremity below the knee) noted. No erythema.  Psychiatric: He has a normal mood and affect. His behavior is normal.    ED Course  Procedures (including critical care time)  Labs Reviewed  CBC WITH DIFFERENTIAL - Abnormal; Notable for the following:    WBC 18.5 (*)    MCH 34.2 (*)    Platelets 73 (*)    Neutrophils Relative % 86 (*)    Neutro Abs 15.9 (*)    Lymphocytes Relative 3 (*)    Lymphs Abs 0.6 (*)    Monocytes Absolute 2.0 (*)    All other components within normal limits  POCT I-STAT, CHEM 8 - Abnormal; Notable for the following:    Glucose, Bld 269 (*)    Calcium, Ion 1.07 (*)    Hemoglobin 17.3 (*)    All other components within normal limits  POCT I-STAT TROPONIN I - Abnormal; Notable for the following:    Troponin i, poc 0.09 (*)    All other components within normal limits  OCCULT BLOOD, POC DEVICE - Abnormal; Notable for the following:    Fecal Occult Bld POSITIVE (*)    All other components within normal limits   Ct Head Wo Contrast  10/27/2012   *RADIOLOGY REPORT*  Clinical Data: Severe headache.  The patient was initially found unresponsive.  Multiple B stains.  CT HEAD WITHOUT CONTRAST  Technique:  Contiguous axial images were obtained from the base of the skull through the vertex without contrast.  Comparison: None.  Findings: Mild generalized atrophy is within normal limits for age. No acute cortical infarct, hemorrhage, mass lesion is evident.  The ventricles are of normal size.  No significant extra-axial fluid collection is present.  Mild scattered mucosal thickening is present in the ethmoid air cells and right sphenoid sinus.  Minimal mucosal thickening is present in  the maxillary and frontal sinuses. The mastoid air cells are clear. The osseous skull is intact.  IMPRESSION:  1.  Normal CT appearance of the brain for age. 2.  Minimal sinus disease.   Original Report Authenticated By: Marin Roberts, M.D.   Dg Chest Port 1 View  10/27/2012   *RADIOLOGY REPORT*  Clinical Data: Syncope/loss of consciousness.  PORTABLE CHEST - 1 VIEW  Comparison: 12/05/2007  Findings: Artifact overlies chest.  The heart size is normal  allowing for technical factors.  Mediastinal shadows are normal. Lungs are clear.  No effusions.  No bony abnormalities.  IMPRESSION: No active disease   Original Report Authenticated By: Paulina Fusi, M.D.     1. Syncope and collapse   2. Leukocytosis   3. Rectal bleeding   4. Thrombocytopenia       MDM  The patient has an EKG that shows a normal sinus rhythm with a first degree AV block. There is left axis deviation, left ventricular hypertrophy and poor R-wave progression. The patient has no significant symptoms at this time but the history is concerning for possible anaphylaxis, initially tachycardic to 130 and hypotensive at 60/40. As his ability to oxygenate improved his blood pressure improved, heart rate came down with IV fluids and medications. He was not given epinephrine prior to arrival.  ED ECG REPORT  I personally interpreted this EKG   Date: 10/27/2012   Rate: 74  Rhythm: normal sinus rhythm  QRS Axis: left  Intervals: PR prolonged  ST/T Wave abnormalities: nonspecific T wave changes  Conduction Disutrbances:first-degree A-V block   Narrative Interpretation: Poor R-wave progression in  Old EKG Reviewed: none available  Pt has had several episodes of dark colored stool, voluminous and watery - on my exam now he has normal appearing anus / perineum, no fissure, no hemorrhoids, has mucousy / bloody colored stool in the rectal vault.  This was hemoccult positive immediately.  He has a leukocytosis and thrombocytopenia and  trop is borderline.  He states that he is feeling better but has nausea and mild abd discomfort.  On repeat exam his abd is soft and non tender.  Will consult with IM for admission. - PCP is Rudi Heap at [facility]   D/w Dr. Ardyth Harps - will admit to telemetry.      Vida Roller, MD 10/27/12 [phone]

## 2012-10-27 NOTE — ED Notes (Signed)
Results of troponin shown to Dr. Hyacinth Meeker

## 2012-10-27 NOTE — H&P (Addendum)
[facility]          History and Physical    PCP:   No primary provider on file.   Chief Complaint:  Unresponsive  HPI: Patient is a very pleasant 79 year old white man who is relatively healthy with the exception of hypertension and is very physically active. He and his wife were in the garden today. She was harvesting some beans while he was tilling the garden. She went inside the house. He states that he stepped into a yellow jacket nest and had multiple stings to his legs; didn't think much about it. About 15 minutes later started to feel dizzy and lightheaded and sat down with his back against the fence; that is the last he remembers until he woke up in the ambulance. His wife states that she looked out the window and saw him at the side of the fence, slumped over with his face in the mud, he was blue and not breathing. She called 911. Upon EMS arrival, he was found to have a pulse in the 140s and was very hypotensive, 60/40. Responded quickly without need to secure an airway with repositioning of his neck. They gave him a liter of IVF en route. CPR was not performed. In the ambulance he vomited x 1. Upon arrival to the ED he was fully conscious, BP and HR have normalized. Was given solumedrol by EMS. Trop 0.09. We have been asked to admit him for observation and further management.  Allergies:  No Known Allergies    Past Medical History  Diagnosis Date  . Diabetes mellitus without complication     No past surgical history on file.  Prior to Admission medications   Medication Sig Start Date End Date Taking? Authorizing Provider  amLODipine (NORVASC) 5 MG tablet Take 5 mg by mouth daily.   Yes Historical Provider, MD  aspirin 81 MG chewable tablet Chew 81 mg by mouth daily.   Yes Historical Provider, MD  atorvastatin (LIPITOR) 10 MG tablet TAKE ONE TABLET BY MOUTH ONE TIME DAILY 09/28/12  Yes Mary-Margaret Daphine Deutscher, FNP  GLIPIZIDE XL 5 MG 24 hr tablet TAKE ONE TABLET  BY MOUTH ONE TIME DAILY 10/20/12  Yes Mary-Margaret Daphine Deutscher, FNP  lisinopril (PRINIVIL,ZESTRIL) 20 MG tablet Take 20 mg by mouth daily.   Yes Historical Provider, MD  metFORMIN (GLUCOPHAGE) 1000 MG tablet Take 1,000 mg by mouth daily with breakfast.   Yes Historical Provider, MD  Multiple Vitamin (MULTIVITAMIN WITH MINERALS) TABS Take 1 tablet by mouth daily.   Yes Historical Provider, MD  omega-3 acid ethyl esters (LOVAZA) 1 G capsule Take 1 g by mouth daily.   Yes Historical Provider, MD    Social History:  reports that he has never smoked. He does not have any smokeless tobacco history on file. He reports that  drinks alcohol. His drug history is not on file.  No family history on file.  Review of Systems:  Constitutional: Denies fever, chills, diaphoresis, appetite change and fatigue.  HEENT: Denies photophobia, eye pain, redness, hearing loss, ear pain, congestion, sore throat, rhinorrhea, sneezing, mouth sores, trouble swallowing, neck pain, neck stiffness and tinnitus.   Respiratory: Denies SOB, DOE, cough, chest tightness,  and wheezing.   Cardiovascular: Denies chest pain, palpitations and leg swelling.  Gastrointestinal: Denies nausea, vomiting, abdominal pain, diarrhea, constipation, blood in stool and abdominal distention.  Genitourinary: Denies dysuria, urgency, frequency, hematuria, flank pain and difficulty urinating.  Endocrine: Denies: hot or cold intolerance, sweats, changes in hair or nails,  polyuria, polydipsia. Musculoskeletal: Denies myalgias, back pain, joint swelling, arthralgias and gait problem.  Skin: Denies pallor, rash and wound.  Neurological: Denies dizziness, seizures, syncope, weakness, light-headedness, numbness and headaches.  Hematological: Denies adenopathy. Easy bruising, personal or family bleeding history  Psychiatric/Behavioral: Denies suicidal ideation, mood changes, confusion, nervousness, sleep disturbance and agitation   Physical Exam: Blood  pressure 156/57, pulse 97, temperature 98.3 F (36.8 C), temperature source Oral, resp. rate 19, height 6' (1.829 m), weight 94.1 kg (207 lb 7.3 oz), SpO2 96.00%. Gen: AA Ox3, NAD HEENT: /AT/PERRL/EOMI Neck: supple, no JVD, no LAD, no bruits, no goiter CV: RRR, +SEM best heard at the upper sternal border Lungs: CTA B Abd: S/NT/ND/+BS/no masses or organomegaly noted Ext: no C/C/E/+pedal pulses. Neuro: grossly intact and non-focal  Labs on Admission:  Results for orders placed during the hospital encounter of 10/27/12 (from the past 48 hour(s))  CBC WITH DIFFERENTIAL     Status: Abnormal   Collection Time    10/27/12 12:05 PM      Result Value Range   WBC 18.5 (*) 4.0 - 10.5 K/uL   RBC 4.97  4.22 - 5.81 MIL/uL   Hemoglobin 17.0  13.0 - 17.0 g/dL   HCT 09.8  11.9 - 14.7 %   MCV 97.4  78.0 - 100.0 fL   MCH 34.2 (*) 26.0 - 34.0 pg   MCHC 35.1  30.0 - 36.0 g/dL   RDW 82.9  56.2 - 13.0 %   Platelets 73 (*) 150 - 400 K/uL   Comment: SPECIMEN CHECKED FOR CLOTS     PLATELET COUNT CONFIRMED BY SMEAR   Neutrophils Relative % 86 (*) 43 - 77 %   Neutro Abs 15.9 (*) 1.7 - 7.7 K/uL   Lymphocytes Relative 3 (*) 12 - 46 %   Lymphs Abs 0.6 (*) 0.7 - 4.0 K/uL   Monocytes Relative 11  3 - 12 %   Monocytes Absolute 2.0 (*) 0.1 - 1.0 K/uL   Eosinophils Relative 0  0 - 5 %   Eosinophils Absolute 0.0  0.0 - 0.7 K/uL   Basophils Relative 0  0 - 1 %   Basophils Absolute 0.0  0.0 - 0.1 K/uL  POCT I-STAT TROPONIN I     Status: Abnormal   Collection Time    10/27/12  2:07 PM      Result Value Range   Troponin i, poc 0.09 (*) 0.00 - 0.08 ng/mL   Comment NOTIFIED PHYSICIAN     Comment 3            Comment: Due to the release kinetics of cTnI,     a negative result within the first hours     of the onset of symptoms does not rule out     myocardial infarction with certainty.     If myocardial infarction is still suspected,     repeat the test at appropriate intervals.  POCT I-STAT, CHEM 8      Status: Abnormal   Collection Time    10/27/12  2:09 PM      Result Value Range   Sodium 142  135 - 145 mEq/L   Potassium 4.3  3.5 - 5.1 mEq/L   Chloride 108  96 - 112 mEq/L   BUN 15  6 - 23 mg/dL   Creatinine, Ser 8.65  0.50 - 1.35 mg/dL   Glucose, Bld 784 (*) 70 - 99 mg/dL   Calcium, Ion 6.96 (*) 1.13 - 1.30 mmol/L  TCO2 20  0 - 100 mmol/L   Hemoglobin 17.3 (*) 13.0 - 17.0 g/dL   HCT 16.1  09.6 - 04.5 %  OCCULT BLOOD, POC DEVICE     Status: Abnormal   Collection Time    10/27/12  3:18 PM      Result Value Range   Fecal Occult Bld POSITIVE (*) NEGATIVE    Radiological Exams on Admission: Ct Head Wo Contrast  10/27/2012   *RADIOLOGY REPORT*  Clinical Data: Severe headache.  The patient was initially found unresponsive.  Multiple B stains.  CT HEAD WITHOUT CONTRAST  Technique:  Contiguous axial images were obtained from the base of the skull through the vertex without contrast.  Comparison: None.  Findings: Mild generalized atrophy is within normal limits for age. No acute cortical infarct, hemorrhage, mass lesion is evident.  The ventricles are of normal size.  No significant extra-axial fluid collection is present.  Mild scattered mucosal thickening is present in the ethmoid air cells and right sphenoid sinus.  Minimal mucosal thickening is present in the maxillary and frontal sinuses. The mastoid air cells are clear. The osseous skull is intact.  IMPRESSION:  1.  Normal CT appearance of the brain for age. 2.  Minimal sinus disease.   Original Report Authenticated By: Marin Roberts, M.D.   Dg Chest Port 1 View  10/27/2012   *RADIOLOGY REPORT*  Clinical Data: Syncope/loss of consciousness.  PORTABLE CHEST - 1 VIEW  Comparison: 12/05/2007  Findings: Artifact overlies chest.  The heart size is normal allowing for technical factors.  Mediastinal shadows are normal. Lungs are clear.  No effusions.  No bony abnormalities.  IMPRESSION: No active disease   Original Report Authenticated By:  Paulina Fusi, M.D.    Assessment/Plan Principal Problem:   Anaphylactic shock Active Problems:   Hypotension   HTN (hypertension)   Elevated troponin   Transient Hypotension/Anaphylactic Shock -Have to presume from histamine release and anaphylactic shock from multiple wasps stings. -Has resolved promptly with IVF. -Agree with admission for observation given typical delayed secondary histamine discharge at around 12 hours post-sting. -Hold all anti-hypertensive agents. -Solumedrol/benadryl/pepcid.  Elevated Troponin -likely stress from above event. -No h/o CAD. -Check ECHO, cycle troponins, follow EKG.  DVT Prophylaxis -Lovenox.   Time Spent on Admission: 80 minutes  HERNANDEZ ACOSTA,ESTELA [facility] Pager: [phone] 10/27/2012, 4:59 PM

## 2012-10-27 NOTE — ED Notes (Signed)
Per [facility] pt was out in garden plowing  away from his house and  Wife found pt unresponsive and called ems. Upon ems arrival pt was sitting w/ face down in dirt bent at waist and purple from chest up and agonal resp and unresponsive. . Pt rolled to back and airway was opened and became to breath slowly at first and color improved and then then started  To breath better and then he vomited. Pt states that he remembered being stung by about 10 bees. No itching pt dry. Has iv 18 rt hand given 500 bolus x 2, 50 benadryl, 125 solumedrol and 4 zofran. . Pt aao but groggy from benedryl no c/o any issues states did have diarrhea yesterday

## 2012-10-28 ENCOUNTER — Encounter (HOSPITAL_COMMUNITY): Payer: Self-pay | Admitting: Internal Medicine

## 2012-10-28 DIAGNOSIS — D72829 Elevated white blood cell count, unspecified: Secondary | ICD-10-CM | POA: Diagnosis present

## 2012-10-28 DIAGNOSIS — E119 Type 2 diabetes mellitus without complications: Secondary | ICD-10-CM

## 2012-10-28 DIAGNOSIS — R7989 Other specified abnormal findings of blood chemistry: Secondary | ICD-10-CM

## 2012-10-28 DIAGNOSIS — I10 Essential (primary) hypertension: Secondary | ICD-10-CM

## 2012-10-28 DIAGNOSIS — T782XXA Anaphylactic shock, unspecified, initial encounter: Secondary | ICD-10-CM

## 2012-10-28 HISTORY — DX: Type 2 diabetes mellitus without complications: E11.9

## 2012-10-28 LAB — GLUCOSE, CAPILLARY
Glucose-Capillary: 273 mg/dL — ABNORMAL HIGH (ref 70–99)
Glucose-Capillary: 277 mg/dL — ABNORMAL HIGH (ref 70–99)

## 2012-10-28 LAB — CBC
HCT: 38.7 % — ABNORMAL LOW (ref 39.0–52.0)
MCHC: 35.1 g/dL (ref 30.0–36.0)
Platelets: 94 10*3/uL — ABNORMAL LOW (ref 150–400)
RDW: 12.5 % (ref 11.5–15.5)
WBC: 15.5 10*3/uL — ABNORMAL HIGH (ref 4.0–10.5)

## 2012-10-28 LAB — BASIC METABOLIC PANEL
BUN: 22 mg/dL (ref 6–23)
Calcium: 8.3 mg/dL — ABNORMAL LOW (ref 8.4–10.5)
Creatinine, Ser: 1.1 mg/dL (ref 0.50–1.35)
GFR calc Af Amer: 71 mL/min — ABNORMAL LOW (ref >90)

## 2012-10-28 LAB — TROPONIN I
Troponin I: <0.3 ng/mL (ref <0.30)
Troponin I: <0.3 ng/mL (ref <0.30)

## 2012-10-28 MED ORDER — LISINOPRIL 20 MG PO TABS: 20.0000 mg | ORAL_TABLET | Freq: Every day | ORAL | Status: DC

## 2012-10-28 MED ORDER — FAMOTIDINE 20 MG PO TABS: 20.0000 mg | ORAL_TABLET | Freq: Two times a day (BID) | ORAL | Status: DC

## 2012-10-28 MED ORDER — PREDNISONE 20 MG PO TABS: 60.0000 mg | ORAL_TABLET | Freq: Two times a day (BID) | ORAL | Status: DC

## 2012-10-28 MED ORDER — AMLODIPINE BESYLATE 5 MG PO TABS: 5.0000 mg | ORAL_TABLET | Freq: Every day | ORAL | Status: DC

## 2012-10-28 MED ORDER — DIPHENHYDRAMINE HCL 25 MG PO TABS: 12.5000 mg | ORAL_TABLET | Freq: Four times a day (QID) | ORAL | Status: DC | PRN

## 2012-10-28 NOTE — Discharge Summary (Signed)
Physician Discharge Summary  Bradley Simmons ZOX:[id number] DOB: [birth year] DOA: 10/27/2012  PCP: Bradley Pierini, FNP  Admit date: 10/27/2012 Discharge date: 10/28/2012  Time spent: 60 minutes  Recommendations for Outpatient Follow-up:  1. Patient is to followup with PCP one week post discharge.  Discharge Diagnoses:  Principal Problem:   Anaphylactic shock Active Problems:   Hypotension   HTN (hypertension)   Elevated troponin   Type II or unspecified type diabetes mellitus without mention of complication, not stated as uncontrolled   Leukocytosis, unspecified   Discharge Condition: Stable and improved  Diet recommendation: Carb modified  Filed Weights   10/27/12 1642 10/28/12 0434  Weight: 94.1 kg (207 lb 7.3 oz) 94.1 kg (207 lb 7.3 oz)    History of present illness:  Patient is a very pleasant 79 year old white man who is relatively healthy with the exception of hypertension and is very physically active. He and his wife were in the garden today. She was harvesting some beans while he was tilling the garden. She went inside the house. He states that he stepped into a yellow jacket nest and had multiple stings to his legs; didn't think much about it. About 15 minutes later started to feel dizzy and lightheaded and sat down with his back against the fence; that is the last he remembers until he woke up in the ambulance. His wife states that she looked out the window and saw him at the side of the fence, slumped over with his face in the mud, he was blue and not breathing. She called 911. Upon EMS arrival, he was found to have a pulse in the 140s and was very hypotensive, 60/40. Responded quickly without need to secure an airway with repositioning of his neck. They gave him a liter of IVF en route. CPR was not performed. In the ambulance he vomited x 1. Upon arrival to the ED he was fully conscious, BP and HR have normalized. Was given solumedrol by EMS. Trop 0.09. We have been asked  to admit him for observation and further management.      Hospital Course:  #1 anaphylactic shock/transient hypotension Patient had presented with hypotension and anaphylactic shock after multiple stings to his legs by yellow jackets after he stepped on a next. Patient was hydrated with IV fluids in the ED on admission as his blood pressure was 60/40. Patient improved quickly and subsequently admitted to the telemetry floor. Patient was placed on IV fluids placed on IV Solu-Medrol, IV Pepcid and IV Benadryl during patient improved clinically did not have any further episodes will be discharged home on a steroid taper, Benadryl taper, Pepcid taper. Patient will followup with PCP as outpatient.  #2 elevated troponin On admission initial set of cardiac enzymes have an elevated troponin which was felt to be stressed induced secondary to problem #1. Subsequent troponin levels which were obtained were negative. Patient denied any chest pain. Patient did not have any EKG changes. Patient was discharged home to followup with PCP as outpatient.  The rest of patient's chronic medical issues remain stable throughout the hospitalization and patient was discharged in stable and improved condition.  Procedures:  CT head 10/27/2012  Chest x-ray 10/27/2012  Consultations:  None  Discharge Exam: Filed Vitals:   10/27/12 2036 10/28/12 0434 10/28/12 0956 10/28/12 1450  BP: 120/58 126/53 122/66 134/57  Pulse: 86 76 80 71  Temp: 97.8 F (36.6 C) 97.2 F (36.2 C) 97.7 F (36.5 C) 97.2 F (36.2 C)  TempSrc: Oral  Oral Oral Oral  Resp: 18 18    Height:      Weight:  94.1 kg (207 lb 7.3 oz)    SpO2: 96% 96% 97% 100%    General: NAD Cardiovascular: RRR with 3/6 SEM Respiratory: CTAB  Discharge Instructions      Discharge Orders   Future Appointments Provider Department Dept Phone   11/18/2012 8:00 AM Bradley Daphine Deutscher, FNP [facility] [phone]   Future Orders  Complete By Expires     Diet Carb Modified  As directed     Discharge instructions  As directed     Comments:      Follow up with Bradley Pierini, FNP in 1 week. Your blood glucose levels may go up because of the prednisone. Monitor closely.    Increase activity slowly  As directed         Medication List    TAKE these medications       amLODipine 5 MG tablet  Commonly known as:  NORVASC  Take 1 tablet (5 mg total) by mouth daily. Resume tomorrow 10/29/12.  Start taking on:  10/29/2012     aspirin 81 MG chewable tablet  Chew 81 mg by mouth daily.     atorvastatin 10 MG tablet  Commonly known as:  LIPITOR  TAKE ONE TABLET BY MOUTH ONE TIME DAILY     diphenhydrAMINE 25 MG tablet  Commonly known as:  BENADRYL  Take 0.5 tablets (12.5 mg total) by mouth every 6 (six) hours as needed for itching. tAKE 1/2 TABLET  (12.5MG ) 2 TIMES DAILY X 2 DAYS, THEN 1/2 TABLET (12.5MG ) DAILY X 3 DAYS THEN STOP.  OTC.     famotidine 20 MG tablet  Commonly known as:  PEPCID  Take 1 tablet (20 mg total) by mouth 2 (two) times daily. Take 1 tablet 2 times daily x 3 days, then 1 tablet daily x 3 days then stop.    OTC     GLIPIZIDE XL 5 MG 24 hr tablet  Generic drug:  glipiZIDE  TAKE ONE TABLET BY MOUTH ONE TIME DAILY     lisinopril 20 MG tablet  Commonly known as:  PRINIVIL,ZESTRIL  Take 1 tablet (20 mg total) by mouth daily. Resume tomorrow.  Start taking on:  10/29/2012     metFORMIN 1000 MG tablet  Commonly known as:  GLUCOPHAGE  Take 1,000 mg by mouth daily with breakfast.     multivitamin with minerals Tabs  Take 1 tablet by mouth daily.     omega-3 acid ethyl esters 1 G capsule  Commonly known as:  LOVAZA  Take 1 g by mouth daily.     predniSONE 20 MG tablet  Commonly known as:  DELTASONE  Take 3 tablets (60 mg total) by mouth 2 (two) times daily. TAKE 3 TABLETS (60MG ) 2 TIMES DAILY X 2 DAYS, THEN 3 TABLETS (60MG ) DAILY X 3 DAYS, THEN 2 TABLETS (40MG ) DAILY X 3 DAYS, THEN  1 TABLET (20MG ) DAILY X 3 DAYS THEN STOP.       No Known Allergies Follow-up Information   Follow up with Bradley Pierini, FNP. Schedule an appointment as soon as possible for a visit in 1 week.   Contact information:   [address] [place] [address] [phone]        The results of significant diagnostics from this hospitalization (including imaging, microbiology, ancillary and laboratory) are listed below for reference.    Significant Diagnostic Studies: Ct Head Wo  Contrast  10/27/2012   *RADIOLOGY REPORT*  Clinical Data: Severe headache.  The patient was initially found unresponsive.  Multiple B stains.  CT HEAD WITHOUT CONTRAST  Technique:  Contiguous axial images were obtained from the base of the skull through the vertex without contrast.  Comparison: None.  Findings: Mild generalized atrophy is within normal limits for age. No acute cortical infarct, hemorrhage, mass lesion is evident.  The ventricles are of normal size.  No significant extra-axial fluid collection is present.  Mild scattered mucosal thickening is present in the ethmoid air cells and right sphenoid sinus.  Minimal mucosal thickening is present in the maxillary and frontal sinuses. The mastoid air cells are clear. The osseous skull is intact.  IMPRESSION:  1.  Normal CT appearance of the brain for age. 2.  Minimal sinus disease.   Original Report Authenticated By: Marin Roberts, M.D.   Dg Chest Port 1 View  10/27/2012   *RADIOLOGY REPORT*  Clinical Data: Syncope/loss of consciousness.  PORTABLE CHEST - 1 VIEW  Comparison: 12/05/2007  Findings: Artifact overlies chest.  The heart size is normal allowing for technical factors.  Mediastinal shadows are normal. Lungs are clear.  No effusions.  No bony abnormalities.  IMPRESSION: No active disease   Original Report Authenticated By: Paulina Fusi, M.D.    Microbiology: No results found for this or any previous visit (from the past 240 hour(s)).    Labs: Basic Metabolic Panel:  Recent Labs Lab 10/27/12 1409 10/27/12 1720 10/28/12 0530  NA 142  --  137  K 4.3  --  4.6  CL 108  --  104  CO2  --   --  23  GLUCOSE 269*  --  279*  BUN 15  --  22  CREATININE 1.30 1.17 1.10  CALCIUM  --   --  8.3*   Liver Function Tests: No results found for this basename: AST, ALT, ALKPHOS, BILITOT, PROT, ALBUMIN,  in the last 168 hours No results found for this basename: LIPASE, AMYLASE,  in the last 168 hours No results found for this basename: AMMONIA,  in the last 168 hours CBC:  Recent Labs Lab 10/27/12 1205 10/27/12 1409 10/27/12 1720 10/28/12 0530  WBC 18.5*  --  21.3* 15.5*  NEUTROABS 15.9*  --   --   --   HGB 17.0 17.3* 16.2 13.6  HCT 48.4 51.0 45.5 38.7*  MCV 97.4  --  96.4 95.8  PLT 73*  --  107* 94*   Cardiac Enzymes:  Recent Labs Lab 10/27/12 1720 10/27/12 2344 10/28/12 0530  TROPONINI <0.30 <0.30 <0.30   BNP: BNP (last 3 results) No results found for this basename: PROBNP,  in the last 8760 hours CBG:  Recent Labs Lab 10/27/12 1658 10/28/12 0634 10/28/12 1119  GLUCAP 311* 273* 260*       Signed:  Viveca Beckstrom  [facility] 10/28/2012, 4:26 PM

## 2012-10-28 NOTE — Progress Notes (Signed)
Utilization Review Completed.Bradley Simmons T6/24/2014  

## 2012-10-28 NOTE — Progress Notes (Signed)
Inpatient Diabetes Program Recommendations  AACE/ADA: New Consensus Statement on Inpatient Glycemic Control (2013)  Target Ranges:  Prepandial:   less than 140 mg/dL      Peak postprandial:   less than 180 mg/dL (1-2 hours)      Critically ill patients:  140 - 180 mg/dL  Results for Bradley Simmons, Bradley Simmons (MRN [id number]) as of 10/28/2012 13:06  Ref. Range 10/27/2012 16:58 10/28/2012 06:34 10/28/2012 11:19  Glucose-Capillary Latest Range: 70-99 mg/dL 409 (H) 811 (H) 914 (H)   Inpatient Diabetes Program Recommendations Correction (SSI): consider Novolog moderate scale q 4 during steroid therapy Thank you  Piedad Climes BSN, RN,CDE Inpatient Diabetes Coordinator [phone] (team pager)

## 2012-10-28 NOTE — Progress Notes (Signed)
Discharge instructions along with med list and script provided. Iv d/c'd with catheter intact. Belongings with patient and family. Patient transported via wheelchair to lobby for d/c home.Bradley Simmons

## 2012-10-29 ENCOUNTER — Other Ambulatory Visit: Payer: Self-pay | Admitting: *Deleted

## 2012-10-30 ENCOUNTER — Other Ambulatory Visit: Payer: Self-pay

## 2012-10-30 ENCOUNTER — Other Ambulatory Visit: Payer: Self-pay | Admitting: Nurse Practitioner

## 2012-10-30 DIAGNOSIS — Z91030 Bee allergy status: Secondary | ICD-10-CM

## 2012-10-30 MED ORDER — EPINEPHRINE 0.3 MG/0.3ML IJ SOAJ: 0.3000 mg | Freq: Once | INTRAMUSCULAR | Status: AC

## 2012-10-30 MED ORDER — GLUCOSE BLOOD VI STRP: ORAL_STRIP | Status: DC

## 2012-11-05 ENCOUNTER — Encounter: Payer: Self-pay | Admitting: Nurse Practitioner

## 2012-11-05 ENCOUNTER — Ambulatory Visit (INDEPENDENT_AMBULATORY_CARE_PROVIDER_SITE_OTHER): Payer: Medicare Other | Admitting: Nurse Practitioner

## 2012-11-05 VITALS — BP 141/60 | HR 66 | Temp 97.8°F | Ht 72.0 in | Wt 205.0 lb

## 2012-11-05 DIAGNOSIS — R739 Hyperglycemia, unspecified: Secondary | ICD-10-CM

## 2012-11-05 DIAGNOSIS — T63461A Toxic effect of venom of wasps, accidental (unintentional), initial encounter: Secondary | ICD-10-CM

## 2012-11-05 DIAGNOSIS — Z09 Encounter for follow-up examination after completed treatment for conditions other than malignant neoplasm: Secondary | ICD-10-CM

## 2012-11-05 DIAGNOSIS — R7309 Other abnormal glucose: Secondary | ICD-10-CM

## 2012-11-05 LAB — GLUCOSE, POCT (MANUAL RESULT ENTRY)

## 2012-11-05 NOTE — Progress Notes (Signed)
  Subjective:    Patient ID: Bradley Simmons, male    DOB: 1929/08/04, 79 y.o.   MRN: [id number]  HPI  Patient was working in his garden early last week and got into a yellowjackets nest and got stung more than 20 times- his wife happened to look out window and see him laying in yard and called 911- by the time EMS got there his heart had quit beating and they had to shock him back- He had to stay in hospital over night. Rx for epipen was given to his wife last week and she was taught how to use.    Review of Systems  All other systems reviewed and are negative.       Objective:   Physical Exam  Constitutional: He is oriented to person, place, and time. He appears well-developed and well-nourished.  Cardiovascular: Normal rate, regular rhythm and normal heart sounds.   Pulmonary/Chest: Effort normal and breath sounds normal.  Neurological: He is alert and oriented to person, place, and time. He has normal reflexes.  Psychiatric: He has a normal mood and affect. His behavior is normal. Judgment and thought content normal.   BP 141/60  Pulse 66  Temp(Src) 97.8 F (36.6 C) (Oral)  Ht 6' (1.829 m)  Wt 205 lb (92.987 kg)  BMI 27.8 kg/m2        Assessment & Plan:   1. Hospital discharge follow-up    Bee sting reaction- keep epipen available at all times- reviewed use again  Mary-Margaret Daphine Deutscher, FNP

## 2012-11-05 NOTE — Patient Instructions (Signed)
Epinephrine injection (Auto-injector) What is this medicine? EPINEPHRINE (ep i NEF rin) is used for the emergency treatment of severe allergic reactions. You should keep this medicine with you at all times. This medicine may be used for other purposes; ask your health care provider or pharmacist if you have questions. What should I tell my health care provider before I take this medicine? They need to know if you have any of the following conditions: -an unusual or allergic reaction to epinephrine, sulfites, other medicines, foods, dyes, or preservatives -pregnant or trying to get pregnant -breast-feeding How should I use this medicine? This medicine is for injection into the outer thigh. Your doctor or health care professional will instruct you on the proper use of the device during an emergency. Read all directions carefully and make sure you understand them. Do not use more often than directed. Talk to your pediatrician regarding the use of this medicine in children. Special care may be needed. This drug is commonly used in children. A special device is available for use in children. Overdosage: If you think you have taken too much of this medicine contact a poison control center or emergency room at once. NOTE: This medicine is only for you. Do not share this medicine with others. What if I miss a dose? This does not apply. You should only use this medicine for an allergic reaction. What may interact with this medicine? This medicine is only used during an emergency. Significant drug interactions are not likely during emergency use. This list may not describe all possible interactions. Give your health care provider a list of all the medicines, herbs, non-prescription drugs, or dietary supplements you use. Also tell them if you smoke, drink alcohol, or use illegal drugs. Some items may interact with your medicine. What should I watch for while using this medicine? Keep this medicine ready for  use in the case of a severe allergic reaction. Make sure that you have the phone number of your doctor or health care professional and local hospital ready. Remember to check the expiration date of your medicine regularly. You may need to have additional units of this medicine with you at work, school, or other places. Talk to your doctor or health care professional about your need for extra units. Some emergencies may require an additional dose. Check with your doctor or a health care professional before using an extra dose. After use, go to the nearest hospital or call 911. Avoid physical activity. Make sure the treating health care professional knows you have received an injection of this medicine. You will receive additional instructions on what to do during and after use of this medicine before a medical emergency occurs. What side effects may I notice from receiving this medicine? Side effects that you should report to your doctor or health care professional as soon as possible: -allergic reactions like skin rash, itching or hives, swelling of the face, lips, or tongue -breathing problems -chest pain -flushing -irregular or pounding heartbeat -numbness in fingers or toes -vomiting Side effects that usually do not require medical attention (report to your doctor or health care professional if they continue or are bothersome): -anxiety or nervousness -dizzy, drowsy -dry mouth -headache -increased sweating -nausea -tired, weak This list may not describe all possible side effects. Call your doctor for medical advice about side effects. You may report side effects to FDA at 1-800-FDA-1088. Where should I keep my medicine? Keep out of the reach of children. Store at room temperature between   15 and 30 degrees C (59 and 86 degrees F). Protect from light and heat. The solution should be clear in color. If the solution is discolored or contains particles it must be replaced. Throw away any unused  medicine after the expiration date. Ask your doctor or pharmacist about proper disposal of the injector if it is expired or has been used. Always replace your auto-injector before it expires. NOTE: This sheet is a summary. It may not cover all possible information. If you have questions about this medicine, talk to your doctor, pharmacist, or health care provider.  2013, [employer]. (08/26/2007 4:32:55 PM)  

## 2012-11-05 NOTE — Addendum Note (Signed)
Addended by: Bennie Pierini on: 11/05/2012 02:54 PM   Modules accepted: Orders

## 2012-11-06 ENCOUNTER — Ambulatory Visit (INDEPENDENT_AMBULATORY_CARE_PROVIDER_SITE_OTHER): Payer: Medicare Other | Admitting: Physician Assistant

## 2012-11-06 VITALS — BP 140/78 | HR 84 | Temp 98.1°F | Ht 72.0 in | Wt 205.0 lb

## 2012-11-06 DIAGNOSIS — R7309 Other abnormal glucose: Secondary | ICD-10-CM

## 2012-11-06 DIAGNOSIS — R739 Hyperglycemia, unspecified: Secondary | ICD-10-CM

## 2012-11-06 LAB — POCT UA - MICROSCOPIC ONLY: Casts, Ur, LPF, POC: NEGATIVE

## 2012-11-06 LAB — POCT URINALYSIS DIPSTICK
Glucose, UA: 250
Nitrite, UA: NEGATIVE
Spec Grav, UA: 1.01
Urobilinogen, UA: NEGATIVE

## 2012-11-06 LAB — GLUCOSE, POCT (MANUAL RESULT ENTRY)

## 2012-11-06 LAB — POCT GLYCOSYLATED HEMOGLOBIN (HGB A1C): Hemoglobin A1C: 7.2

## 2012-11-06 NOTE — Progress Notes (Signed)
Subjective:     Patient ID: Bradley Simmons, male   DOB: July 10, 1929, 79 y.o.   MRN: [id number]  HPI Pt here due to elevated BS reading yest He was being seen for f/u after near death experience due to mult bee stings Pt left office before the result of his BS reading was reviewed Level was above 400 Pt states he feels better today He is currently finishing a sterapred dose pack and had large intake of sugary fruit yest States BS nl this am  Review of Systems  All other systems reviewed and are negative.       Objective:   Physical Exam  Nursing note and vitals reviewed. NAD No bruits Heart- RRR w 2/6 sys murmur Lungs- CTA Pulses equal in upper ext UA- no ketones BS- only sl elevated A1C- sl elevation     Assessment:    Diabetes Anaphy. rxn to bees    Plan:     BS back to his normal and pt feeling fine No change in meds Cont to watch diet and push water Keep appt with MMM on July 15

## 2012-11-06 NOTE — Patient Instructions (Signed)
Diets for Diabetes, Food Labeling Look at food labels to help you decide how much of a product you can eat. You will want to check the amount of total carbohydrate in a serving to see how the food fits into your meal plan. In the list of ingredients, the ingredient present in the largest amount by weight must be listed first, followed by the other ingredients in descending order. STANDARD OF IDENTITY Most products have a list of ingredients. However, foods that the Food and Drug Administration (FDA) has given a standard of identity do not need a list of ingredients. A standard of identity means that a food must contain certain ingredients if it is called a particular name. Examples are mayonnaise, peanut butter, ketchup, jelly, and cheese. LABELING TERMS There are many terms found on food labels. Some of these terms have specific definitions. Some terms are regulated by the FDA, and the FDA has clearly specified how they can be used. Others are not regulated or well-defined and can be misleading and confusing. SPECIFICALLY DEFINED TERMS Nutritive Sweetener.  A sweetener that contains calories,such as table sugar or honey. Nonnutritive Sweetener.  A sweetener with few or no calories,such as saccharin, aspartame, sucralose, and cyclamate. LABELING TERMS REGULATED BY THE FDA Free.  The product contains only a tiny or small amount of fat, cholesterol, sodium, sugar, or calories. For example, a "fat-free" product will contain less than 0.5 g of fat per serving. Low.  A food described as "low" in fat, saturated fat, cholesterol, sodium, or calories could be eaten fairly often without exceeding dietary guidelines. For example, "low in fat" means no more than 3 g of fat per serving. Lean.  "Lean" and "extra lean" are U.S. Department of Agriculture (USDA) terms for use on meat and poultry products. "Lean" means the product contains less than 10 g of fat, 4 g of saturated fat, and 95 mg of cholesterol  per serving. "Lean" is not as low in fat as a product labeled "low." Extra Lean.  "Extra lean" means the product contains less than 5 g of fat, 2 g of saturated fat, and 95 mg of cholesterol per serving. While "extra lean" has less fat than "lean," it is still higher in fat than a product labeled "low." Reduced, Less, Fewer.  A diet product that contains 25% less of a nutrient or calories than the regular version. For example, hot dogs might be labeled "25% less fat than our regular hot dogs." Light/Lite.  A diet product that contains  fewer calories or  the fat of the original. For example, "light in sodium" means a product with  the usual sodium. More.  One serving contains at least 10% more of the daily value of a vitamin, mineral, or fiber than usual. Good Source Of.  One serving contains 10% to 19% of the daily value for a particular vitamin, mineral, or fiber. Excellent Source Of.  One serving contains 20% or more of the daily value for a particular nutrient. Other terms used might be "high in" or "rich in." Enriched or Fortified.  The product contains added vitamins, minerals, or protein. Nutrition labeling must be used on enriched or fortified foods. Imitation.  The product has been altered so that it is lower in protein, vitamins, or minerals than the usual food,such as imitation peanut butter. Total Fat.  The number listed is the total of all fat found in a serving of the product. Under total fat, food labels must list saturated fat and   trans fat, which are associated with raising bad cholesterol and an increased risk of heart blood vessel disease. Saturated Fat.  Mainly fats from animal-based sources. Some examples are red meat, cheese, cream, whole milk, and coconut oil. Trans Fat.  Found in some fried snack foods, packaged foods, and fried restaurant foods. It is recommended you eat as close to 0 g of trans fat as possible, since it raises bad cholesterol and lowers  good cholesterol. Polyunsaturated and Monounsaturated Fats.  More healthful fats. These fats are from plant sources. Total Carbohydrate.  The number of carbohydrate grams in a serving of the product. Under total carbohydrate are listed the other carbohydrate sources, such as dietary fiber and sugars. Dietary Fiber.  A carbohydrate from plant sources. Sugars.  Sugars listed on the label contain all naturally occurring sugars as well as added sugars. LABELING TERMS NOT REGULATED BY THE FDA Sugarless.  Table sugar (sucrose) has not been added. However, the manufacturer may use another form of sugar in place of sucrose to sweeten the product. For example, sugar alcohols are used to sweeten foods. Sugar alcohols are a form of sugar but are not table sugar. If a product contains sugar alcohols in place of sucrose, it can still be labeled "sugarless." Low Salt, Salt-Free, Unsalted, No Salt, No Salt Added, Without Added Salt.  Food that is usually processed with salt has been made without salt. However, the food may contain sodium-containing additives, such as preservatives, leavening agents, or flavorings. Natural.  This term has no legal meaning. Organic.  Foods that are certified as organic have been inspected and approved by the USDA to ensure they are produced without pesticides, fertilizers containing synthetic ingredients, bioengineering, or ionizing radiation. Document Released: 04/26/2003 Document Revised: 07/16/2011 Document Reviewed: 11/11/2008 [employer] Patient Information 2014 [employer].  

## 2012-11-17 ENCOUNTER — Other Ambulatory Visit: Payer: Self-pay | Admitting: Nurse Practitioner

## 2012-11-18 ENCOUNTER — Ambulatory Visit (INDEPENDENT_AMBULATORY_CARE_PROVIDER_SITE_OTHER): Payer: Medicare Other | Admitting: Nurse Practitioner

## 2012-11-18 ENCOUNTER — Encounter: Payer: Self-pay | Admitting: Nurse Practitioner

## 2012-11-18 VITALS — BP 155/69 | HR 67 | Temp 97.1°F | Ht 72.0 in | Wt 205.0 lb

## 2012-11-18 DIAGNOSIS — I10 Essential (primary) hypertension: Secondary | ICD-10-CM

## 2012-11-18 DIAGNOSIS — E119 Type 2 diabetes mellitus without complications: Secondary | ICD-10-CM

## 2012-11-18 DIAGNOSIS — E785 Hyperlipidemia, unspecified: Secondary | ICD-10-CM

## 2012-11-18 LAB — COMPLETE METABOLIC PANEL WITH GFR
ALT: 30 U/L (ref 0–53)
AST: 24 U/L (ref 0–37)
Alkaline Phosphatase: 68 U/L (ref 39–117)
Chloride: 103 mEq/L (ref 96–112)
Creat: 0.81 mg/dL (ref 0.50–1.35)
Total Bilirubin: 1.3 mg/dL — ABNORMAL HIGH (ref 0.3–1.2)

## 2012-11-18 MED ORDER — LISINOPRIL 20 MG PO TABS: 20.0000 mg | ORAL_TABLET | Freq: Every day | ORAL | Status: DC

## 2012-11-18 MED ORDER — AMLODIPINE BESYLATE 5 MG PO TABS: 5.0000 mg | ORAL_TABLET | Freq: Every day | ORAL | Status: DC

## 2012-11-18 MED ORDER — ATORVASTATIN CALCIUM 10 MG PO TABS: 10.0000 mg | ORAL_TABLET | Freq: Every day | ORAL | Status: AC

## 2012-11-18 MED ORDER — GLIPIZIDE ER 5 MG PO TB24: 5.0000 mg | ORAL_TABLET | Freq: Every day | ORAL | Status: AC

## 2012-11-18 MED ORDER — METFORMIN HCL 1000 MG PO TABS: 1000.0000 mg | ORAL_TABLET | Freq: Every day | ORAL | Status: AC

## 2012-11-18 NOTE — Patient Instructions (Addendum)
Health Maintenance, Males A healthy lifestyle and preventative care can promote health and wellness.  Maintain regular health, dental, and eye exams.  Eat a healthy diet. Foods like vegetables, fruits, whole grains, low-fat dairy products, and lean protein foods contain the nutrients you need without too many calories. Decrease your intake of foods high in solid fats, added sugars, and salt. Get information about a proper diet from your caregiver, if necessary.  Regular physical exercise is one of the most important things you can do for your health. Most adults should get at least 150 minutes of moderate-intensity exercise (any activity that increases your heart rate and causes you to sweat) each week. In addition, most adults need muscle-strengthening exercises on 2 or more days a week.   Maintain a healthy weight. The body mass index (BMI) is a screening tool to identify possible weight problems. It provides an estimate of body fat based on height and weight. Your caregiver can help determine your BMI, and can help you achieve or maintain a healthy weight. For adults 20 years and older:  A BMI below 18.5 is considered underweight.  A BMI of 18.5 to 24.9 is normal.  A BMI of 25 to 29.9 is considered overweight.  A BMI of 30 and above is considered obese.  Maintain normal blood lipids and cholesterol by exercising and minimizing your intake of saturated fat. Eat a balanced diet with plenty of fruits and vegetables. Blood tests for lipids and cholesterol should begin at age 20 and be repeated every 5 years. If your lipid or cholesterol levels are high, you are over 50, or you are a high risk for heart disease, you may need your cholesterol levels checked more frequently.Ongoing high lipid and cholesterol levels should be treated with medicines, if diet and exercise are not effective.  If you smoke, find out from your caregiver how to quit. If you do not use tobacco, do not start.  If you  choose to drink alcohol, do not exceed 2 drinks per day. One drink is considered to be 12 ounces (355 mL) of beer, 5 ounces (148 mL) of wine, or 1.5 ounces (44 mL) of liquor.  Avoid use of street drugs. Do not share needles with anyone. Ask for help if you need support or instructions about stopping the use of drugs.  High blood pressure causes heart disease and increases the risk of stroke. Blood pressure should be checked at least every 1 to 2 years. Ongoing high blood pressure should be treated with medicines if weight loss and exercise are not effective.  If you are 45 to 77 years old, ask your caregiver if you should take aspirin to prevent heart disease.  Diabetes screening involves taking a blood sample to check your fasting blood sugar level. This should be done once every 3 years, after age 45, if you are within normal weight and without risk factors for diabetes. Testing should be considered at a younger age or be carried out more frequently if you are overweight and have at least 1 risk factor for diabetes.  Colorectal cancer can be detected and often prevented. Most routine colorectal cancer screening begins at the age of 50 and continues through age 75. However, your caregiver may recommend screening at an earlier age if you have risk factors for colon cancer. On a yearly basis, your caregiver may provide home test kits to check for hidden blood in the stool. Use of a small camera at the end of a tube,   to directly examine the colon (sigmoidoscopy or colonoscopy), can detect the earliest forms of colorectal cancer. Talk to your caregiver about this at age 50, when routine screening begins. Direct examination of the colon should be repeated every 5 to 10 years through age 75, unless early forms of pre-cancerous polyps or small growths are found.  Hepatitis C blood testing is recommended for all people born from 1945 through 1965 and any individual with known risks for hepatitis C.  Healthy  men should no longer receive prostate-specific antigen (PSA) blood tests as part of routine cancer screening. Consult with your caregiver about prostate cancer screening.  Testicular cancer screening is not recommended for adolescents or adult males who have no symptoms. Screening includes self-exam, caregiver exam, and other screening tests. Consult with your caregiver about any symptoms you have or any concerns you have about testicular cancer.  Practice safe sex. Use condoms and avoid high-risk sexual practices to reduce the spread of sexually transmitted infections (STIs).  Use sunscreen with a sun protection factor (SPF) of 30 or greater. Apply sunscreen liberally and repeatedly throughout the day. You should seek shade when your shadow is shorter than you. Protect yourself by wearing long sleeves, pants, a wide-brimmed hat, and sunglasses year round, whenever you are outdoors.  Notify your caregiver of new moles or changes in moles, especially if there is a change in shape or color. Also notify your caregiver if a mole is larger than the size of a pencil eraser.  A one-time screening for abdominal aortic aneurysm (AAA) and surgical repair of large AAAs by sound wave imaging (ultrasonography) is recommended for ages 65 to 75 years who are current or former smokers.  Stay current with your immunizations. Document Released: 10/20/2007 Document Revised: 07/16/2011 Document Reviewed: 09/18/2010 [employer] Patient Information 2014 [employer].  

## 2012-11-18 NOTE — Progress Notes (Signed)
Subjective:    Patient ID: Bradley Simmons, male    DOB: August 10, 1929, 79 y.o.   MRN: [id number]  Hypertension This is a chronic problem. The current episode started more than 1 year ago. The problem has been waxing and waning since onset. The problem is controlled (pt states he takes his BP at home and avg is 135s/70s). Pertinent negatives include no chest pain, palpitations or shortness of breath. Risk factors for coronary artery disease include male gender, dyslipidemia and diabetes mellitus. Past treatments include calcium channel blockers and ACE inhibitors. The current treatment provides mild improvement. There is no history of a thyroid problem. There is no history of sleep apnea.  Hyperlipidemia This is a chronic problem. The current episode started more than 1 year ago. The problem is controlled. Recent lipid tests were reviewed and are normal. Exacerbating diseases include diabetes. Pertinent negatives include no chest pain or shortness of breath. Current antihyperlipidemic treatment includes statins. The current treatment provides moderate improvement of lipids. Risk factors for coronary artery disease include diabetes mellitus, dyslipidemia, hypertension and male sex.  Diabetes He presents for his follow-up diabetic visit. He has type 2 diabetes mellitus. His disease course has been stable. There are no hypoglycemic associated symptoms. Pertinent negatives for diabetes include no chest pain, no foot ulcerations and no visual change. There are no hypoglycemic complications. Symptoms are stable. There are no diabetic complications. Risk factors for coronary artery disease include diabetes mellitus, dyslipidemia, male sex and hypertension. Current diabetic treatment includes oral agent (dual therapy). He is compliant with treatment all of the time. His weight is stable. He is following a diabetic diet. He participates in exercise daily. His home blood glucose trend is fluctuating minimally. His  breakfast blood glucose is taken between 7-8 am. His breakfast blood glucose range is generally 110-130 mg/dl. An ACE inhibitor/angiotensin II receptor blocker is being taken. He does not see a podiatrist.Eye exam is current.      Review of Systems  Respiratory: Negative for shortness of breath.   Cardiovascular: Negative for chest pain and palpitations.  All other systems reviewed and are negative.       Objective:   Physical Exam  Constitutional: He is oriented to person, place, and time. He appears well-developed and well-nourished.  HENT:  Head: Normocephalic.  Right Ear: External ear normal.  Left Ear: External ear normal.  Eyes: Pupils are equal, round, and reactive to light.  Neck: Normal range of motion. Neck supple. No thyromegaly present.  Cardiovascular: Normal rate, regular rhythm and intact distal pulses.   Murmur heard. Pulmonary/Chest: Effort normal and breath sounds normal.  Abdominal: Soft. Bowel sounds are normal. There is no tenderness.  Musculoskeletal: Normal range of motion. He exhibits no edema.  Neurological: He is alert and oriented to person, place, and time.  Skin: Skin is warm and dry.  Psychiatric: He has a normal mood and affect. His behavior is normal. Judgment and thought content normal.    BP 155/69  Pulse 67  Temp(Src) 97.1 F (36.2 C) (Oral)  Ht 6' (1.829 m)  Wt 205 lb (92.987 kg)  BMI 27.8 kg/m2  See diabetic foot note      Assessment & Plan:  1. Hypertension Low Na+ diet - amLODipine (NORVASC) 5 MG tablet; Take 1 tablet (5 mg total) by mouth daily. Resume tomorrow 10/29/12.  Dispense: 30 tablet; Refill: 5 - lisinopril (PRINIVIL,ZESTRIL) 20 MG tablet; Take 1 tablet (20 mg total) by mouth daily. Resume tomorrow.  Dispense: 90 tablet;  Refill: 5 - COMPLETE METABOLIC PANEL WITH GFR  2. Hyperlipidemia Low fat diet an dexercise - atorvastatin (LIPITOR) 10 MG tablet; Take 1 tablet (10 mg total) by mouth daily.  Dispense: 30 tablet;  Refill: 5 - NMR Lipoprofile with Lipids  3. Type II or unspecified type diabetes mellitus without mention of complication, not stated as uncontrolled Watch carbs - glipiZIDE (GLIPIZIDE XL) 5 MG 24 hr tablet; Take 1 tablet (5 mg total) by mouth daily.  Dispense: 30 tablet; Refill: 5 - metFORMIN (GLUCOPHAGE) 1000 MG tablet; Take 1 tablet (1,000 mg total) by mouth daily with breakfast.  Dispense: 90 tablet; Refill: 5  Mary-Margaret Daphine Deutscher, FNP

## 2012-11-19 LAB — NMR LIPOPROFILE WITH LIPIDS
Cholesterol, Total: 152 mg/dL (ref <200)
HDL Particle Number: 40.2 umol/L (ref >=30.5)
HDL-C: 59 mg/dL (ref >=40)
LDL (calc): 73 mg/dL (ref <100)
LP-IR Score: 36 (ref <=45)
Triglycerides: 102 mg/dL (ref <150)
VLDL Size: 46.4 nm (ref <=46.6)

## 2012-11-27 ENCOUNTER — Other Ambulatory Visit: Payer: Self-pay

## 2012-11-27 DIAGNOSIS — I10 Essential (primary) hypertension: Secondary | ICD-10-CM

## 2012-11-27 MED ORDER — AMLODIPINE BESYLATE 5 MG PO TABS: 5.0000 mg | ORAL_TABLET | Freq: Every day | ORAL | Status: AC

## 2013-02-19 ENCOUNTER — Other Ambulatory Visit: Payer: Self-pay | Admitting: Family Medicine

## 2013-02-20 ENCOUNTER — Encounter (HOSPITAL_COMMUNITY): Payer: Self-pay

## 2013-03-02 ENCOUNTER — Other Ambulatory Visit: Payer: Self-pay

## 2013-03-02 MED ORDER — ONETOUCH DELICA LANCETS 33G MISC: 1.0000 | Freq: Four times a day (QID) | Status: DC

## 2013-03-04 ENCOUNTER — Other Ambulatory Visit: Payer: Self-pay | Admitting: *Deleted

## 2013-03-04 MED ORDER — ONETOUCH DELICA LANCETS 33G MISC: 1.0000 | Freq: Four times a day (QID) | Status: AC

## 2013-03-09 ENCOUNTER — Encounter (HOSPITAL_COMMUNITY): Payer: Self-pay | Admitting: *Deleted

## 2013-03-09 ENCOUNTER — Encounter (HOSPITAL_COMMUNITY)
Admission: RE | Disposition: A | Discharge status: Home / Self Care | Payer: Self-pay | Source: Ambulatory Visit | Attending: Ophthalmology

## 2013-03-09 ENCOUNTER — Ambulatory Visit (HOSPITAL_COMMUNITY)
Admission: RE | Admit: 2013-03-09 | Discharge: 2013-03-09 | Disposition: A | Discharge status: Home / Self Care | Payer: Medicare Other | Source: Ambulatory Visit | Attending: Ophthalmology | Admitting: Ophthalmology

## 2013-03-09 DIAGNOSIS — H26499 Other secondary cataract, unspecified eye: Secondary | ICD-10-CM | POA: Insufficient documentation

## 2013-03-09 HISTORY — PX: YAG LASER APPLICATION: SHX6189

## 2013-03-09 SURGERY — TREATMENT, USING YAG LASER
Anesthesia: LOCAL | Laterality: Right

## 2013-03-09 MED ORDER — TETRACAINE HCL 0.5 % OP SOLN
OPHTHALMIC | Status: AC
  Filled 2013-03-09: qty 2

## 2013-03-09 MED ORDER — APRACLONIDINE HCL 1 % OP SOLN
1.0000 [drp] | OPHTHALMIC | Status: AC
  Administered 2013-03-09: 1 [drp] via OPHTHALMIC

## 2013-03-09 MED ORDER — TETRACAINE HCL 0.5 % OP SOLN
1.0000 [drp] | Freq: Once | OPHTHALMIC | Status: AC
  Administered 2013-03-09: 1 [drp] via OPHTHALMIC

## 2013-03-09 MED ORDER — TROPICAMIDE 1 % OP SOLN
OPHTHALMIC | Status: AC
  Filled 2013-03-09: qty 3

## 2013-03-09 MED ORDER — TROPICAMIDE 1 % OP SOLN
1.0000 [drp] | OPHTHALMIC | Status: AC
  Administered 2013-03-09: 1 [drp] via OPHTHALMIC

## 2013-03-09 MED ORDER — APRACLONIDINE HCL 1 % OP SOLN
OPHTHALMIC | Status: AC
  Filled 2013-03-09: qty 0.1

## 2013-03-09 NOTE — Brief Op Note (Signed)
Bradley Simmons 03/09/2013  Susa Simmonds, MD  Yag Laser Self Test Completedyes. Procedure: Posterior Capsulotomy, right eye.  Eye Protection Worn by Staff yes. Laser In Use Sign on Door yes.  Laser: Nd:YAG Spot Size: Fixed Burst Mode: III Power Setting: 2.1 mJ/burst Position treated: posterior capsule Number of shots: 36 Total energy delivered: 72.2 mJ  The patient tolerated the procedure without difficulty. No complications were encountered.   The patient was discharged home with the instructions to continue all his current glaucoma medications, if any.   Patient verbalizes understanding of discharge instructions yes.   Notes:moderately opacified posterior capsule

## 2013-03-09 NOTE — Progress Notes (Signed)
Ok that patient got one drop of each eye drop per dr Lita Mains. pupil well dilated

## 2013-03-09 NOTE — H&P (Signed)
I have reviewed the pre printed H&P, the patient was re-examined, and I have identified no significant interval changes in the patient's medical condition.  There is no change in the plan of care since the history and physical of record. 

## 2013-03-09 NOTE — Op Note (Signed)
Procedure - not required

## 2013-04-05 ENCOUNTER — Encounter (HOSPITAL_COMMUNITY): Payer: Self-pay | Admitting: Emergency Medicine

## 2013-04-05 ENCOUNTER — Emergency Department (HOSPITAL_COMMUNITY): Payer: Medicare Other

## 2013-04-05 ENCOUNTER — Inpatient Hospital Stay (HOSPITAL_COMMUNITY)
Admission: EM | Admit: 2013-04-05 | Discharge: 2013-04-07 | DRG: 432 | Disposition: A | Discharge status: Home / Self Care | Payer: Medicare Other | Attending: Family Medicine | Admitting: Family Medicine

## 2013-04-05 DIAGNOSIS — M129 Arthropathy, unspecified: Secondary | ICD-10-CM | POA: Diagnosis present

## 2013-04-05 DIAGNOSIS — K7030 Alcoholic cirrhosis of liver without ascites: Principal | ICD-10-CM | POA: Diagnosis present

## 2013-04-05 DIAGNOSIS — I81 Portal vein thrombosis: Secondary | ICD-10-CM | POA: Diagnosis present

## 2013-04-05 DIAGNOSIS — S22079A Unspecified fracture of T9-T10 vertebra, initial encounter for closed fracture: Secondary | ICD-10-CM

## 2013-04-05 DIAGNOSIS — F1020 Alcohol dependence, uncomplicated: Secondary | ICD-10-CM | POA: Diagnosis present

## 2013-04-05 DIAGNOSIS — C220 Liver cell carcinoma: Secondary | ICD-10-CM

## 2013-04-05 DIAGNOSIS — Z85828 Personal history of other malignant neoplasm of skin: Secondary | ICD-10-CM

## 2013-04-05 DIAGNOSIS — Z87891 Personal history of nicotine dependence: Secondary | ICD-10-CM

## 2013-04-05 DIAGNOSIS — E119 Type 2 diabetes mellitus without complications: Secondary | ICD-10-CM | POA: Diagnosis present

## 2013-04-05 DIAGNOSIS — E7800 Pure hypercholesterolemia, unspecified: Secondary | ICD-10-CM | POA: Diagnosis present

## 2013-04-05 DIAGNOSIS — K7460 Unspecified cirrhosis of liver: Secondary | ICD-10-CM

## 2013-04-05 DIAGNOSIS — K766 Portal hypertension: Secondary | ICD-10-CM | POA: Diagnosis present

## 2013-04-05 DIAGNOSIS — K652 Spontaneous bacterial peritonitis: Secondary | ICD-10-CM | POA: Diagnosis not present

## 2013-04-05 DIAGNOSIS — I8500 Esophageal varices without bleeding: Secondary | ICD-10-CM | POA: Diagnosis present

## 2013-04-05 DIAGNOSIS — I8510 Secondary esophageal varices without bleeding: Secondary | ICD-10-CM | POA: Diagnosis present

## 2013-04-05 DIAGNOSIS — R7402 Elevation of levels of lactic acid dehydrogenase (LDH): Secondary | ICD-10-CM | POA: Diagnosis present

## 2013-04-05 DIAGNOSIS — D72829 Elevated white blood cell count, unspecified: Secondary | ICD-10-CM

## 2013-04-05 DIAGNOSIS — K769 Liver disease, unspecified: Secondary | ICD-10-CM | POA: Diagnosis present

## 2013-04-05 DIAGNOSIS — I959 Hypotension, unspecified: Secondary | ICD-10-CM

## 2013-04-05 DIAGNOSIS — R7401 Elevation of levels of liver transaminase levels: Secondary | ICD-10-CM | POA: Diagnosis present

## 2013-04-05 DIAGNOSIS — Z87442 Personal history of urinary calculi: Secondary | ICD-10-CM

## 2013-04-05 DIAGNOSIS — D6959 Other secondary thrombocytopenia: Secondary | ICD-10-CM | POA: Diagnosis present

## 2013-04-05 DIAGNOSIS — R109 Unspecified abdominal pain: Secondary | ICD-10-CM | POA: Diagnosis present

## 2013-04-05 DIAGNOSIS — R160 Hepatomegaly, not elsewhere classified: Secondary | ICD-10-CM | POA: Diagnosis present

## 2013-04-05 DIAGNOSIS — K219 Gastro-esophageal reflux disease without esophagitis: Secondary | ICD-10-CM | POA: Diagnosis present

## 2013-04-05 DIAGNOSIS — D696 Thrombocytopenia, unspecified: Secondary | ICD-10-CM | POA: Diagnosis present

## 2013-04-05 DIAGNOSIS — I10 Essential (primary) hypertension: Secondary | ICD-10-CM | POA: Diagnosis present

## 2013-04-05 DIAGNOSIS — R188 Other ascites: Secondary | ICD-10-CM | POA: Diagnosis present

## 2013-04-05 DIAGNOSIS — C228 Malignant neoplasm of liver, primary, unspecified as to type: Secondary | ICD-10-CM | POA: Diagnosis present

## 2013-04-05 LAB — URINALYSIS W MICROSCOPIC + REFLEX CULTURE
Leukocytes, UA: NEGATIVE
Nitrite: NEGATIVE
Protein, ur: NEGATIVE mg/dL
Specific Gravity, Urine: <1.005 — ABNORMAL LOW (ref 1.005–1.030)
Urobilinogen, UA: 2 mg/dL — ABNORMAL HIGH (ref 0.0–1.0)

## 2013-04-05 LAB — COMPREHENSIVE METABOLIC PANEL
ALT: 119 U/L — ABNORMAL HIGH (ref 0–53)
AST: 262 U/L — ABNORMAL HIGH (ref 0–37)
Albumin: 2.8 g/dL — ABNORMAL LOW (ref 3.5–5.2)
Alkaline Phosphatase: 440 U/L — ABNORMAL HIGH (ref 39–117)
Calcium: 10.1 mg/dL (ref 8.4–10.5)
Chloride: 99 mEq/L (ref 96–112)
Creatinine, Ser: 0.8 mg/dL (ref 0.50–1.35)
Glucose, Bld: 101 mg/dL — ABNORMAL HIGH (ref 70–99)
Potassium: 4.1 mEq/L (ref 3.5–5.1)
Sodium: 134 mEq/L — ABNORMAL LOW (ref 135–145)

## 2013-04-05 LAB — CBC WITH DIFFERENTIAL/PLATELET
Basophils Absolute: 0 10*3/uL (ref 0.0–0.1)
Basophils Relative: 0 % (ref 0–1)
Eosinophils Relative: 2 % (ref 0–5)
HCT: 44.5 % (ref 39.0–52.0)
Lymphs Abs: 0.8 10*3/uL (ref 0.7–4.0)
MCHC: 33.7 g/dL (ref 30.0–36.0)
Monocytes Absolute: 0.7 10*3/uL (ref 0.1–1.0)
Neutro Abs: 3.7 10*3/uL (ref 1.7–7.7)
Platelets: 133 10*3/uL — ABNORMAL LOW (ref 150–400)
RDW: 13.6 % (ref 11.5–15.5)

## 2013-04-05 LAB — GLUCOSE, CAPILLARY: Glucose-Capillary: 100 mg/dL — ABNORMAL HIGH (ref 70–99)

## 2013-04-05 LAB — TROPONIN I: Troponin I: <0.3 ng/mL (ref <0.30)

## 2013-04-05 LAB — LACTIC ACID, PLASMA: Lactic Acid, Venous: 2.4 mmol/L — ABNORMAL HIGH (ref 0.5–2.2)

## 2013-04-05 MED ORDER — THIAMINE HCL 100 MG/ML IJ SOLN: 100.0000 mg | Freq: Every day | INTRAMUSCULAR | Status: DC

## 2013-04-05 MED ORDER — LORAZEPAM 1 MG PO TABS: 1.0000 mg | ORAL_TABLET | Freq: Four times a day (QID) | ORAL | Status: DC | PRN

## 2013-04-05 MED ORDER — HYDROMORPHONE HCL PF 1 MG/ML IJ SOLN: 0.5000 mg | INTRAMUSCULAR | Status: DC | PRN

## 2013-04-05 MED ORDER — ONDANSETRON HCL 4 MG/2ML IJ SOLN: 4.0000 mg | Freq: Four times a day (QID) | INTRAMUSCULAR | Status: DC | PRN

## 2013-04-05 MED ORDER — ADULT MULTIVITAMIN W/MINERALS CH
1.0000 | ORAL_TABLET | Freq: Every day | ORAL | Status: DC
  Administered 2013-04-06 – 2013-04-07 (×2): 1 via ORAL
  Filled 2013-04-05 (×2): qty 1

## 2013-04-05 MED ORDER — IOHEXOL 300 MG/ML  SOLN
100.0000 mL | Freq: Once | INTRAMUSCULAR | Status: AC | PRN
  Administered 2013-04-05: 100 mL via INTRAVENOUS

## 2013-04-05 MED ORDER — VITAMIN B-1 100 MG PO TABS
100.0000 mg | ORAL_TABLET | Freq: Every day | ORAL | Status: DC
  Administered 2013-04-05 – 2013-04-07 (×3): 100 mg via ORAL
  Filled 2013-04-05 (×3): qty 1

## 2013-04-05 MED ORDER — SODIUM CHLORIDE 0.9 % IV SOLN: 250.0000 mL | INTRAVENOUS | Status: DC | PRN

## 2013-04-05 MED ORDER — ONDANSETRON HCL 4 MG PO TABS: 4.0000 mg | ORAL_TABLET | Freq: Four times a day (QID) | ORAL | Status: DC | PRN

## 2013-04-05 MED ORDER — SODIUM CHLORIDE 0.9 % IJ SOLN
3.0000 mL | Freq: Two times a day (BID) | INTRAMUSCULAR | Status: DC
  Administered 2013-04-06 (×2): 3 mL via INTRAVENOUS

## 2013-04-05 MED ORDER — IOHEXOL 300 MG/ML  SOLN
50.0000 mL | Freq: Once | INTRAMUSCULAR | Status: AC | PRN
  Administered 2013-04-05: 50 mL via ORAL

## 2013-04-05 MED ORDER — DEXTROSE 5 % IV SOLN
2.0000 g | INTRAVENOUS | Status: DC
  Administered 2013-04-05 – 2013-04-06 (×2): 2 g via INTRAVENOUS
  Filled 2013-04-05 (×3): qty 2

## 2013-04-05 MED ORDER — LORAZEPAM 2 MG/ML IJ SOLN: 1.0000 mg | Freq: Four times a day (QID) | INTRAMUSCULAR | Status: DC | PRN

## 2013-04-05 MED ORDER — SODIUM CHLORIDE 0.9 % IV SOLN
INTRAVENOUS | Status: AC
  Administered 2013-04-05: 18:00:00 via INTRAVENOUS

## 2013-04-05 MED ORDER — LISINOPRIL 10 MG PO TABS
10.0000 mg | ORAL_TABLET | Freq: Every day | ORAL | Status: DC
  Administered 2013-04-06 – 2013-04-07 (×2): 10 mg via ORAL
  Filled 2013-04-05 (×2): qty 1

## 2013-04-05 MED ORDER — FAMOTIDINE 20 MG PO TABS
20.0000 mg | ORAL_TABLET | Freq: Every day | ORAL | Status: DC
  Administered 2013-04-06: 20 mg via ORAL
  Filled 2013-04-05 (×2): qty 1

## 2013-04-05 MED ORDER — OXYCODONE HCL 5 MG PO TABS
5.0000 mg | ORAL_TABLET | ORAL | Status: DC | PRN
  Administered 2013-04-06 – 2013-04-07 (×2): 5 mg via ORAL
  Filled 2013-04-05 (×2): qty 1

## 2013-04-05 MED ORDER — INSULIN ASPART 100 UNIT/ML ~~LOC~~ SOLN
0.0000 [IU] | Freq: Three times a day (TID) | SUBCUTANEOUS | Status: DC
  Administered 2013-04-06 (×2): 2 [IU] via SUBCUTANEOUS
  Administered 2013-04-07: 3 [IU] via SUBCUTANEOUS

## 2013-04-05 MED ORDER — HEPARIN SODIUM (PORCINE) 5000 UNIT/ML IJ SOLN
5000.0000 [IU] | Freq: Three times a day (TID) | INTRAMUSCULAR | Status: DC
  Administered 2013-04-05 – 2013-04-07 (×5): 5000 [IU] via SUBCUTANEOUS
  Filled 2013-04-05 (×5): qty 1

## 2013-04-05 MED ORDER — SODIUM CHLORIDE 0.9 % IV SOLN
INTRAVENOUS | Status: DC
  Administered 2013-04-05 (×2): via INTRAVENOUS

## 2013-04-05 MED ORDER — FOLIC ACID 1 MG PO TABS
1.0000 mg | ORAL_TABLET | Freq: Every day | ORAL | Status: DC
  Administered 2013-04-06 – 2013-04-07 (×2): 1 mg via ORAL
  Filled 2013-04-05 (×2): qty 1

## 2013-04-05 MED ORDER — ONDANSETRON HCL 4 MG/2ML IJ SOLN
4.0000 mg | INTRAMUSCULAR | Status: DC | PRN
  Administered 2013-04-05: 4 mg via INTRAVENOUS
  Filled 2013-04-05: qty 2

## 2013-04-05 MED ORDER — AMLODIPINE BESYLATE 5 MG PO TABS
5.0000 mg | ORAL_TABLET | Freq: Every day | ORAL | Status: DC
  Administered 2013-04-06 – 2013-04-07 (×2): 5 mg via ORAL
  Filled 2013-04-05 (×2): qty 1

## 2013-04-05 MED ORDER — FENTANYL CITRATE 0.05 MG/ML IJ SOLN
25.0000 ug | INTRAMUSCULAR | Status: DC | PRN
  Administered 2013-04-05: 25 ug via INTRAVENOUS
  Filled 2013-04-05: qty 2

## 2013-04-05 MED ORDER — SODIUM CHLORIDE 0.9 % IJ SOLN
3.0000 mL | INTRAMUSCULAR | Status: DC | PRN
  Administered 2013-04-07: 3 mL via INTRAVENOUS

## 2013-04-05 MED ORDER — ONDANSETRON HCL 4 MG/2ML IJ SOLN: 4.0000 mg | Freq: Three times a day (TID) | INTRAMUSCULAR | Status: DC | PRN

## 2013-04-05 MED ORDER — HYDROMORPHONE HCL PF 1 MG/ML IJ SOLN
0.5000 mg | INTRAMUSCULAR | Status: DC | PRN
  Administered 2013-04-05 – 2013-04-06 (×2): 0.5 mg via INTRAVENOUS
  Filled 2013-04-05 (×3): qty 1

## 2013-04-05 MED ORDER — ASPIRIN 81 MG PO CHEW
81.0000 mg | CHEWABLE_TABLET | Freq: Every day | ORAL | Status: DC
  Administered 2013-04-07: 81 mg via ORAL
  Filled 2013-04-05 (×2): qty 1

## 2013-04-05 MED ORDER — DEXTROSE 5 % IV SOLN
INTRAVENOUS | Status: AC
  Filled 2013-04-05: qty 2

## 2013-04-05 NOTE — ED Notes (Signed)
Pt c/o generalized abdominal pain x1 week. Pt describes pain as cramping. Pt reports lack of appetite since symptoms began. Pt denies N/V/D and denies fever.

## 2013-04-05 NOTE — H&P (Signed)
History and Physical  Bradley Simmons ZOX:[id number] DOB: 08-29-1929 DOA: 04/05/2013  Referring physician: Dr. Clarene Duke in ED PCP: Rudi Heap, MD   Chief Complaint: Abdominal pain  HPI:  79 year old man presents the emergency department with abdominal pain. Initial evaluation was notable for evidence of liver failure, cirrhosis, gastroesophageal varices, probable hepatocellular carcinoma all new findings.--  Patient reports abdominal pain for one to 2 weeks which has been progressing, it is located in the lower abdomen. No associated nausea or vomiting. Described as an ache. Maximum intensity 8/10. Because of continued without improvement he came to the emergency department for further evaluation. He has a history of significant alcohol use in the past and still drinks 2-3 beers per day.  In the emergency department afebrile with stable vital signs. Complete metabolic panel notable for elevated transaminases, total bilirubin and alkaline phosphatase. CBC unremarkable. Urinalysis negative. Chest x-ray negative. CT findings as above.  Review of Systems:  Negative for fever, visual changes, sore throat, rash, new muscle aches, chest pain, SOB, dysuria, bleeding, n/v.  Past Medical History  Diagnosis Date  . Basal cell carcinoma of nose ~ 2010    points to right side of nose" (26-Nov-2012)  . Hypertension   . Hypercholesteremia   . Heart murmur     "found out recently" (11-26-2012)  . Type II diabetes mellitus   . Arthritis     "joints mostly" (November 26, 2012)  . Kidney stone 1980's?    "passed on it's own" (November 26, 2012)  . Anaphylactic reaction November 26, 2012    "stung by a mess of yellow jackets" (11-26-2012)  . Type II or unspecified type diabetes mellitus without mention of complication, not stated as uncontrolled 10/28/2012    Past Surgical History  Procedure Laterality Date  . Back surgery    . Lumbar disc surgery  1985    "scraped out calcium deposits" (2012-11-26)  . Cataract extraction w/  intraocular lens implant Right 2013  . Skin cancer excision  ~ 2010    "right side of my nose" (November 26, 2012)  . Yag laser application Right 03/09/2013    Procedure: YAG LASER APPLICATION;  Surgeon: Susa Simmonds, MD;  Location: [facility];  Service: Ophthalmology;  Laterality: Right;    Social History:  reports that he quit smoking about 20 years ago. He has never used smokeless tobacco. He reports that he drinks about 3.6 ounces of alcohol per week. He reports that he does not use illicit drugs.  Allergies  Allergen Reactions  . Bee Venom Anaphylaxis and Swelling    Family History  Problem Relation Age of Onset  . Cancer Mother     sinus     Prior to Admission medications   Medication Sig Start Date End Date Taking? Authorizing Provider  amLODipine (NORVASC) 5 MG tablet Take 1 tablet (5 mg total) by mouth daily. Resume tomorrow 10/29/12. 11/27/12  Yes Coralie Keens, FNP  aspirin 81 MG chewable tablet Chew 81 mg by mouth daily.   Yes Historical Provider, MD  atorvastatin (LIPITOR) 10 MG tablet Take 1 tablet (10 mg total) by mouth daily. 11/18/12  Yes Mary-Margaret Daphine Deutscher, FNP  Bismuth Subsalicylate (PEPTO-BISMOL PO) Take 10 mLs by mouth daily as needed (heart burn).   Yes Historical Provider, MD  glipiZIDE (GLIPIZIDE XL) 5 MG 24 hr tablet Take 1 tablet (5 mg total) by mouth daily. 11/18/12  Yes Mary-Margaret Daphine Deutscher, FNP  lisinopril (PRINIVIL,ZESTRIL) 10 MG tablet Take 10 mg by mouth daily.   Yes Historical Provider, MD  metFORMIN (  GLUCOPHAGE) 1000 MG tablet Take 1 tablet (1,000 mg total) by mouth daily with breakfast. 11/18/12  Yes Mary-Margaret Daphine Deutscher, FNP  Multiple Vitamin (MULTIVITAMIN WITH MINERALS) TABS Take 1 tablet by mouth daily.   Yes Historical Provider, MD  ranitidine (ZANTAC) 150 MG tablet Take 150 mg by mouth daily as needed for heartburn.   Yes Historical Provider, MD  EPINEPHrine (EPI-PEN) 0.3 mg/0.3 mL SOAJ Inject 0.3 mLs (0.3 mg total) into the muscle once. 10/30/12    Mary-Margaret Daphine Deutscher, FNP  ONE TOUCH ULTRA TEST test strip USE TO CHECK BLOOD SUGAR TWICE DAILY 02/19/13   Ernestina Penna, MD  [facility] DELICA LANCETS 33G MISC 1 Stick by Does not apply route QID. 03/04/13   Mary-Margaret Daphine Deutscher, FNP   Physical Exam: Filed Vitals:   04/05/13 1320  BP: 154/80  Pulse: 105  Temp: 97.9 F (36.6 C)  TempSrc: Oral  Resp: 20  SpO2: 100%    General: Examined in emergency department. Appears calm and comfortable Eyes: PERRL, normal lids, irises  ENT: grossly normal hearing, lips & tongue Neck: no LAD, masses or thyromegaly Cardiovascular: RRR, no m/r/g. No LE edema. Respiratory: CTA bilaterally, no w/r/r. Normal respiratory effort. Abdomen: soft, distended, nontender. Positive bowel sounds. Skin: no rash or induration seen  Musculoskeletal: grossly normal tone BUE/BLE Psychiatric: grossly normal mood and affect, speech fluent and appropriate Neurologic: grossly non-focal.  Wt Readings from Last 3 Encounters:  11/18/12 92.987 kg (205 lb)  11/06/12 92.987 kg (205 lb)  11/05/12 92.987 kg (205 lb)    Labs on Admission:  Basic Metabolic Panel:  Recent Labs Lab 04/05/13 1353  NA 134*  K 4.1  CL 99  CO2 26  GLUCOSE 101*  BUN 9  CREATININE 0.80  CALCIUM 10.1    Liver Function Tests:  Recent Labs Lab 04/05/13 1353  AST 262*  ALT 119*  ALKPHOS 440*  BILITOT 4.6*  PROT 7.5  ALBUMIN 2.8*    Recent Labs Lab 04/05/13 1353  LIPASE 46   CBC:  Recent Labs Lab 04/05/13 1353  WBC 5.4  NEUTROABS 3.7  HGB 15.0  HCT 44.5  MCV 96.7  PLT 133*    Cardiac Enzymes:  Recent Labs Lab 04/05/13 1353  TROPONINI <0.30    CBG:  Recent Labs Lab 04/05/13 1324  GLUCAP 100*     Radiological Exams on Admission: Dg Chest 2 View  04/05/2013   CLINICAL DATA:  Abdominal pain  EXAM: CHEST  2 VIEW  COMPARISON:  Chest radiograph 10/27/2012 and 12/05/2007  FINDINGS: Mild cardiomegaly. Normal pulmonary vascularity mediastinal contours.  Atherosclerotic calcification of the thoracic aortic arch. No airspace disease. No visible pleural effusion or pneumothorax. No acute osseous abnormality.  IMPRESSION: Mild cardiomegaly.  No acute findings.   Electronically Signed   By: Britta Mccreedy M.D.   On: 04/05/2013 15:55   Ct Abdomen Pelvis W Contrast  04/05/2013   CLINICAL DATA:  Skin cancer.  Abdominal pain.  EXAM: CT ABDOMEN AND PELVIS WITH CONTRAST  TECHNIQUE: Multidetector CT imaging of the abdomen and pelvis was performed using the standard protocol following bolus administration of intravenous contrast.  CONTRAST:  50mL OMNIPAQUE IOHEXOL 300 MG/ML SOLN, OMNIPAQUE IOHEXOL 300 MG/ML SOLN  COMPARISON:  None.  FINDINGS: Diminished intravenous contrast limits the exam.  The liver is cirrhotic characterize by a nodular contour and enlargement of the left lobe. There is ill-defined infiltrating low density within the anterior segment of the right lobe of the liver measuring 15.6 x 7.4 x 10.6  cm. Underlying malignancy is suggested.  There is a filling defect occluding the right portal vein extending into the distal main portal vein. It has elevated lb Foley in a MEASUREMENTS worrisome for tumor occlusion rather than thrombus. There is poor filling of the superior mesenteric vein loculated to phase of contrast. Splenic vein is patent.  Pelvic cysts in both kidneys. Simple cysts in both kidneys within the cortex. No definite solid renal mass.  Small amount of free fluid about the liver and spleen. The spleen is normal in size.  Pancreas, adrenal glands, gallbladder are within normal limits.  Umbilical vein is recannulized, a finding associated with portal hypertension.  Several sub cm nodules are seen at the posterior lung bases. Some are calcified. There are patchy densities at the posterior lower right lung base related to pneumonitis or volume loss.  Several venous structures at the gastroesophageal junction are worrisome for a gastroesophageal  varices. There is a small amount of free fluid in the pelvis.  Left inguinal hernia thinning adipose tissue.  Advanced degenerative changes in the lumbar spine. T10 wedge compression deformity of indeterminate age is present involving its superior endplate. There is 20% loss of height anteriorly no definite destructive bone lesion.  IMPRESSION: Cirrhosis.  Ascites.  Gastroesophageal varices.  Fidnikgns worrisome for hepatocellular carcinoma with tumor extension into the right portal vein resulting in occlusion.  T10 compression fracture of indeterminate age.  Bibasilar sub cm pulmonary nodules.   Electronically Signed   By: Maryclare Bean M.D.   On: 04/05/2013 15:34    EKG: Independently reviewed. Normal sinus rhythm, left ventricular hypertrophy. No acute changes.   Principal Problem:   Abdominal pain Active Problems:   Type II or unspecified type diabetes mellitus without mention of complication, not stated as uncontrolled   SBP (spontaneous bacterial peritonitis)   Cirrhosis of liver   Varices, esophageal   Thrombocytopenia, unspecified   Assessment/Plan 1. New diagnosis cirrhosis with ascites, gastroesophageal varices with findings worrisome for hepatocellular carcinoma. Discussed with interpreting radiologist Dr. Chipper Oman vein occlusion is from invading tumor. No evidence of thrombus. Supportive care, pain control. Check AFP, consider biopsy if AFP equivocal. GI consultation in the morning.           2. Abdominal pain: Lower abdomen. Likely secondary to the underlying malignancy and cirrhosis but SBP a possibility. Start empiric antibiotics. Diagnostic paracentesis 12/1. 3. Thrombocytopenia: Mild. Secondary to cirrhosis. Monitor clinically. 4. Generalized abdominal pain: Secondary to CT findings. Supportive care. 5. Hypertension: Stable. Continue lisinopril. 6. Type 2 diabetes mellitus: Sliding scale insulin. Hold oral medications, resume on discharge. Check basic metabolic panel  morning. 7.  alcohol abuse, possible dependence: CIWA.  I discussed CT findings with the patient and recommended admission for further evaluation, pain control, antibiotics and diagnostic paracentesis. We discussed pursuing biopsy as an in or outpatient based on clinical response.  Code Status: full code DVT prophylaxis: SCDs Family Communication: discussed with brother-in-law and sister Disposition Plan/Anticipated LOS: admit  Time spent: 56 minutes  Brendia Sacks, MD  [facility] Pager [phone] 04/05/2013, 4:24 PM

## 2013-04-05 NOTE — ED Provider Notes (Signed)
CSN: [id number]     Arrival date & time 04/05/13  1307 History   First MD Initiated Contact with Patient 04/05/13 1320     Chief Complaint  Patient presents with  . Abdominal Pain    HPI Pt was seen at 1345.   Per pt, c/o gradual onset and persistence of constant generalized abd "pain" for the past 1 week.  Has been associated with nausea and decreased PO intake.  Describes the abd pain as "cramping," and "sharp."  Denies vomiting/diarrhea, no fevers, no back pain, no rash, no CP/SOB, no black or blood in stools.      Past Medical History  Diagnosis Date  . Basal cell carcinoma of nose ~ 2010    points to right side of nose" (2012/11/07)  . Hypertension   . Hypercholesteremia   . Heart murmur     "found out recently" (2012/11/07)  . Type II diabetes mellitus   . Arthritis     "joints mostly" (November 07, 2012)  . Kidney stone 1980's?    "passed on it's own" (11/07/12)  . Anaphylactic reaction 2012/11/07    "stung by a mess of yellow jackets" (2012-11-07)  . Type II or unspecified type diabetes mellitus without mention of complication, not stated as uncontrolled 10/28/2012   Past Surgical History  Procedure Laterality Date  . Back surgery    . Lumbar disc surgery  1985    "scraped out calcium deposits" (07-Nov-2012)  . Cataract extraction w/ intraocular lens implant Right 2013  . Skin cancer excision  ~ 2010    "right side of my nose" (Nov 07, 2012)  . Yag laser application Right 03/09/2013    Procedure: YAG LASER APPLICATION;  Surgeon: Susa Simmonds, MD;  Location: [facility];  Service: Ophthalmology;  Laterality: Right;   Family History  Problem Relation Age of Onset  . Cancer Mother     sinus   History  Substance Use Topics  . Smoking status: Former Smoker -- 2 years    Quit date: 11/18/1992  . Smokeless tobacco: Never Used  . Alcohol Use: 3.6 oz/week    6 Cans of beer per week     Comment: 11-07-2012 "have used alot of alcohol"    Review of Systems ROS: Statement: All systems  negative except as marked or noted in the HPI; Constitutional: Negative for fever and chills. ; ; Eyes: Negative for eye pain, redness and discharge. ; ; ENMT: Negative for ear pain, hoarseness, nasal congestion, sinus pressure and sore throat. ; ; Cardiovascular: Negative for chest pain, palpitations, diaphoresis, dyspnea and peripheral edema. ; ; Respiratory: Negative for cough, wheezing and stridor. ; ; Gastrointestinal: +nausea, abd pain. Negative for vomiting, diarrhea, blood in stool, hematemesis, jaundice and rectal bleeding. . ; ; Genitourinary: Negative for dysuria, flank pain and hematuria. ; ; Musculoskeletal: Negative for back pain and neck pain. Negative for swelling and trauma.; ; Skin: Negative for pruritus, rash, abrasions, blisters, bruising and skin lesion.; ; Neuro: Negative for headache, lightheadedness and neck stiffness. Negative for weakness, altered level of consciousness , altered mental status, extremity weakness, paresthesias, involuntary movement, seizure and syncope.     Allergies  Bee venom  Home Medications   Current Outpatient Rx  Name  Route  Sig  Dispense  Refill  . amLODipine (NORVASC) 5 MG tablet   Oral   Take 1 tablet (5 mg total) by mouth daily. Resume tomorrow 10/29/12.   30 tablet   5   . aspirin 81 MG chewable tablet  Oral   Chew 81 mg by mouth daily.         Marland Kitchen atorvastatin (LIPITOR) 10 MG tablet   Oral   Take 1 tablet (10 mg total) by mouth daily.   30 tablet   5   . Bismuth Subsalicylate (PEPTO-BISMOL PO)   Oral   Take 10 mLs by mouth daily as needed (heart burn).         Marland Kitchen glipiZIDE (GLIPIZIDE XL) 5 MG 24 hr tablet   Oral   Take 1 tablet (5 mg total) by mouth daily.   30 tablet   5   . lisinopril (PRINIVIL,ZESTRIL) 10 MG tablet   Oral   Take 10 mg by mouth daily.         . metFORMIN (GLUCOPHAGE) 1000 MG tablet   Oral   Take 1 tablet (1,000 mg total) by mouth daily with breakfast.   90 tablet   5   . Multiple Vitamin  (MULTIVITAMIN WITH MINERALS) TABS   Oral   Take 1 tablet by mouth daily.         . ranitidine (ZANTAC) 150 MG tablet   Oral   Take 150 mg by mouth daily as needed for heartburn.         Marland Kitchen EPINEPHrine (EPI-PEN) 0.3 mg/0.3 mL SOAJ   Intramuscular   Inject 0.3 mLs (0.3 mg total) into the muscle once.   2 Device   3   . ONE TOUCH ULTRA TEST test strip      USE TO CHECK BLOOD SUGAR TWICE DAILY   100 each   0   . ONETOUCH DELICA LANCETS 33G MISC   Does not apply   1 Stick by Does not apply route QID.   100 each   0     DX CODE 250.0    BP 154/80  Pulse 105  Temp(Src) 97.9 F (36.6 C) (Oral)  Resp 20  SpO2 100% Physical Exam 1350: Physical examination:  Nursing notes reviewed; Vital signs and O2 SAT reviewed;  Constitutional: Well developed, Well nourished, Well hydrated, In no acute distress; Head:  Normocephalic, atraumatic; Eyes: EOMI, PERRL, No scleral icterus; ENMT: Mouth and pharynx normal, Mucous membranes moist; Neck: Supple, Full range of motion, No lymphadenopathy; Cardiovascular: Regular rate and rhythm, No gallop; Respiratory: Breath sounds clear & equal bilaterally, No wheezes.  Speaking full sentences with ease, Normal respiratory effort/excursion; Chest: Nontender, Movement normal; Abdomen: Soft, +mild diffuse tenderness to palp. +distended, tympanitic. Normal bowel sounds; Genitourinary: No CVA tenderness; Extremities: Pulses normal, No tenderness, No edema, No calf edema or asymmetry.; Neuro: AA&Ox3, Major CN grossly intact.  Speech clear. No gross focal motor or sensory deficits in extremities.; Skin: Color normal, Warm, Dry.   ED Course  Procedures    EKG Interpretation   None       MDM  MDM Reviewed: previous chart, nursing note and vitals Reviewed previous: labs and ECG Interpretation: labs, ECG, x-ray and CT scan     Results for orders placed during the hospital encounter of 04/05/13  GLUCOSE, CAPILLARY      Result Value Range    Glucose-Capillary 100 (*) 70 - 99 mg/dL  CBC WITH DIFFERENTIAL      Result Value Range   WBC 5.4  4.0 - 10.5 K/uL   RBC 4.60  4.22 - 5.81 MIL/uL   Hemoglobin 15.0  13.0 - 17.0 g/dL   HCT 16.1  09.6 - 04.5 %   MCV 96.7  78.0 - 100.0 fL  MCH 32.6  26.0 - 34.0 pg   MCHC 33.7  30.0 - 36.0 g/dL   RDW 40.9  81.1 - 91.4 %   Platelets 133 (*) 150 - 400 K/uL   Neutrophils Relative % 69  43 - 77 %   Neutro Abs 3.7  1.7 - 7.7 K/uL   Lymphocytes Relative 15  12 - 46 %   Lymphs Abs 0.8  0.7 - 4.0 K/uL   Monocytes Relative 14 (*) 3 - 12 %   Monocytes Absolute 0.7  0.1 - 1.0 K/uL   Eosinophils Relative 2  0 - 5 %   Eosinophils Absolute 0.1  0.0 - 0.7 K/uL   Basophils Relative 0  0 - 1 %   Basophils Absolute 0.0  0.0 - 0.1 K/uL  COMPREHENSIVE METABOLIC PANEL      Result Value Range   Sodium 134 (*) 135 - 145 mEq/L   Potassium 4.1  3.5 - 5.1 mEq/L   Chloride 99  96 - 112 mEq/L   CO2 26  19 - 32 mEq/L   Glucose, Bld 101 (*) 70 - 99 mg/dL   BUN 9  6 - 23 mg/dL   Creatinine, Ser 7.82  0.50 - 1.35 mg/dL   Calcium 95.6  8.4 - 21.3 mg/dL   Total Protein 7.5  6.0 - 8.3 g/dL   Albumin 2.8 (*) 3.5 - 5.2 g/dL   AST 086 (*) 0 - 37 U/L   ALT 119 (*) 0 - 53 U/L   Alkaline Phosphatase 440 (*) 39 - 117 U/L   Total Bilirubin 4.6 (*) 0.3 - 1.2 mg/dL   GFR calc non Af Amer 82 (*) >90 mL/min   GFR calc Af Amer >90  >90 mL/min  LIPASE, BLOOD      Result Value Range   Lipase 46  11 - 59 U/L  LACTIC ACID, PLASMA      Result Value Range   Lactic Acid, Venous 2.4 (*) 0.5 - 2.2 mmol/L  TROPONIN I      Result Value Range   Troponin I <0.30  <0.30 ng/mL  URINALYSIS W MICROSCOPIC + REFLEX CULTURE      Result Value Range   Color, Urine YELLOW  YELLOW   APPearance CLEAR  CLEAR   Specific Gravity, Urine <1.005 (*) 1.005 - 1.030   pH 6.0  5.0 - 8.0   Glucose, UA NEGATIVE  NEGATIVE mg/dL   Hgb urine dipstick NEGATIVE  NEGATIVE   Bilirubin Urine SMALL (*) NEGATIVE   Ketones, ur NEGATIVE  NEGATIVE mg/dL    Protein, ur NEGATIVE  NEGATIVE mg/dL   Urobilinogen, UA 2.0 (*) 0.0 - 1.0 mg/dL   Nitrite NEGATIVE  NEGATIVE   Leukocytes, UA NEGATIVE  NEGATIVE   WBC, UA 0-2  <3 WBC/hpf   RBC / HPF 0-2  <3 RBC/hpf   Bacteria, UA RARE  RARE    Results for ARISTEO, HANKERSON (MRN [id number]) as of 04/05/2013 15:12  Ref. Range 12/05/2007 05:13 12/06/2007 04:30 11/18/2012 08:52 04/05/2013 13:53  AST Latest Range: 0-37 U/L 139 (H) 72 (H) 24 262 (H)  ALT Latest Range: 0-53 U/L 85 (H) 66 (H) 30 119 (H)  Total Bilirubin Latest Range: 0.3-1.2 mg/dL 3.0 (H) 1.8 (H) 1.3 (H) 4.6 (H)    Dg Chest 2 View 04/05/2013   CLINICAL DATA:  Abdominal pain  EXAM: CHEST  2 VIEW  COMPARISON:  Chest radiograph 10/27/2012 and 12/05/2007  FINDINGS: Mild cardiomegaly. Normal pulmonary vascularity mediastinal contours. Atherosclerotic calcification  of the thoracic aortic arch. No airspace disease. No visible pleural effusion or pneumothorax. No acute osseous abnormality.  IMPRESSION: Mild cardiomegaly.  No acute findings.   Electronically Signed   By: Britta Mccreedy M.D.   On: 04/05/2013 15:55   Ct Abdomen Pelvis W Contrast 04/05/2013   CLINICAL DATA:  Skin cancer.  Abdominal pain.  EXAM: CT ABDOMEN AND PELVIS WITH CONTRAST  TECHNIQUE: Multidetector CT imaging of the abdomen and pelvis was performed using the standard protocol following bolus administration of intravenous contrast.  CONTRAST:  50mL OMNIPAQUE IOHEXOL 300 MG/ML SOLN, OMNIPAQUE IOHEXOL 300 MG/ML SOLN  COMPARISON:  None.  FINDINGS: Diminished intravenous contrast limits the exam.  The liver is cirrhotic characterize by a nodular contour and enlargement of the left lobe. There is ill-defined infiltrating low density within the anterior segment of the right lobe of the liver measuring 15.6 x 7.4 x 10.6 cm. Underlying malignancy is suggested.  There is a filling defect occluding the right portal vein extending into the distal main portal vein. It has elevated lb Foley in a MEASUREMENTS  worrisome for tumor occlusion rather than thrombus. There is poor filling of the superior mesenteric vein loculated to phase of contrast. Splenic vein is patent.  Pelvic cysts in both kidneys. Simple cysts in both kidneys within the cortex. No definite solid renal mass.  Small amount of free fluid about the liver and spleen. The spleen is normal in size.  Pancreas, adrenal glands, gallbladder are within normal limits.  Umbilical vein is recannulized, a finding associated with portal hypertension.  Several sub cm nodules are seen at the posterior lung bases. Some are calcified. There are patchy densities at the posterior lower right lung base related to pneumonitis or volume loss.  Several venous structures at the gastroesophageal junction are worrisome for a gastroesophageal varices. There is a small amount of free fluid in the pelvis.  Left inguinal hernia thinning adipose tissue.  Advanced degenerative changes in the lumbar spine. T10 wedge compression deformity of indeterminate age is present involving its superior endplate. There is 20% loss of height anteriorly no definite destructive bone lesion.  IMPRESSION: Cirrhosis.  Ascites.  Gastroesophageal varices.  Fidnikgns worrisome for hepatocellular carcinoma with tumor extension into the right portal vein resulting in occlusion.  T10 compression fracture of indeterminate age.  Bibasilar sub cm pulmonary nodules.   Electronically Signed   By: Maryclare Bean M.D.   On: 04/05/2013 15:34     1600:  Dx and testing d/w pt and family.  Questions answered.  Verb understanding, agreeable to admit. T/C to [facility] Dr. Irene Limbo, case discussed, including:  HPI, pertinent PM/SHx, VS/PE, dx testing, ED course and treatment:  Agreeable to admit, requests to write temporary orders, obtain medical bed to team 1.   Laray Anger, DO 04/06/13 1213

## 2013-04-06 ENCOUNTER — Inpatient Hospital Stay (HOSPITAL_COMMUNITY): Payer: Medicare Other

## 2013-04-06 ENCOUNTER — Encounter (HOSPITAL_COMMUNITY): Payer: Self-pay | Admitting: Gastroenterology

## 2013-04-06 DIAGNOSIS — K769 Liver disease, unspecified: Secondary | ICD-10-CM

## 2013-04-06 DIAGNOSIS — R160 Hepatomegaly, not elsewhere classified: Secondary | ICD-10-CM | POA: Diagnosis present

## 2013-04-06 DIAGNOSIS — K7460 Unspecified cirrhosis of liver: Secondary | ICD-10-CM

## 2013-04-06 DIAGNOSIS — E119 Type 2 diabetes mellitus without complications: Secondary | ICD-10-CM

## 2013-04-06 LAB — IRON AND TIBC
Saturation Ratios: 14 % — ABNORMAL LOW (ref 20–55)
TIBC: 241 ug/dL (ref 215–435)
UIBC: 207 ug/dL (ref 125–400)

## 2013-04-06 LAB — GLUCOSE, CAPILLARY
Glucose-Capillary: 116 mg/dL — ABNORMAL HIGH (ref 70–99)
Glucose-Capillary: 181 mg/dL — ABNORMAL HIGH (ref 70–99)

## 2013-04-06 LAB — COMPREHENSIVE METABOLIC PANEL
AST: 271 U/L — ABNORMAL HIGH (ref 0–37)
Alkaline Phosphatase: 397 U/L — ABNORMAL HIGH (ref 39–117)
BUN: 9 mg/dL (ref 6–23)
CO2: 25 mEq/L (ref 19–32)
Chloride: 101 mEq/L (ref 96–112)
Creatinine, Ser: 0.84 mg/dL (ref 0.50–1.35)
GFR calc non Af Amer: 80 mL/min — ABNORMAL LOW (ref >90)
Potassium: 3.9 mEq/L (ref 3.5–5.1)
Sodium: 137 mEq/L (ref 135–145)
Total Bilirubin: 4 mg/dL — ABNORMAL HIGH (ref 0.3–1.2)

## 2013-04-06 LAB — CBC
HCT: 42.2 % (ref 39.0–52.0)
MCV: 97.5 fL (ref 78.0–100.0)
Platelets: 132 10*3/uL — ABNORMAL LOW (ref 150–400)
RBC: 4.33 MIL/uL (ref 4.22–5.81)
RDW: 13.9 % (ref 11.5–15.5)
WBC: 5 10*3/uL (ref 4.0–10.5)

## 2013-04-06 LAB — PROTIME-INR
INR: 1.19 (ref 0.00–1.49)
Prothrombin Time: 14.8 seconds (ref 11.6–15.2)

## 2013-04-06 MED ORDER — GADOXETATE DISODIUM 0.25 MMOL/ML IV SOLN
10.0000 mL | Freq: Once | INTRAVENOUS | Status: AC | PRN
  Administered 2013-04-06: 10 mL via INTRAVENOUS

## 2013-04-06 NOTE — Progress Notes (Addendum)
Hypoglycemic Event  CBG: 55 at 0716  Treatment: 15 GM carbohydrate snack (juice)  Symptoms: None  Follow-up CBG: Time:0745 CBG Result:116  Possible Reasons for Event: Unknown  Comments/MD notified: Dr. Irene Limbo notified per text-page.  04/06/13 0952 Late entry for 0755. Received call back from MD. No new orders at this time. Boris Sharper Joy  Remember to initiate Hypoglycemia Order Set & complete

## 2013-04-06 NOTE — Progress Notes (Signed)
[facility] PROGRESS NOTE  Bradley Simmons:[id number] DOB: [birth year] DOA: 04/05/2013 PCP: Rudi Heap, MD  Assessment/Plan: New diagnosis cirrhosis with ascites, gastroesophageal varices with findings worrisome for hepatocellular carcinoma. Discussed with interpreting radiologist Dr. Chipper Oman vein occlusion is from invading tumor. No evidence of thrombus. Supportive care, pain control. Check AFP, consider biopsy if AFP equivocal.    Abdominal pain: Lower abdomen. Likely secondary to the underlying malignancy and cirrhosis but SBP a possibility. Continue empiric antibiotics. Diagnostic paracentesis 12/1.    Thrombocytopenia: Mild. Secondary to cirrhosis. Monitor clinically.  Generalized abdominal pain: Improved this am. Secondary to CT findings. Supportive care.  Hypertension: Remains stable. Continue lisinopril.   Type 2 diabetes mellitus: CBG range 116-120. Sliding scale insulin. Hold oral medications, resume on discharge. Check basic metabolic panel morning.   alcohol abuse, possible dependence: no s/sx withdrawal CIWA   Code Status: full Family Communication: none present Disposition Plan: home when ready   Consultants:  GI   Procedures:  Paracentesis  Antibiotics:  Rocephin 04/05/13>>  HPI/Subjective: Watching TV. Denies pain/discomfort  Objective: Filed Vitals:   04/06/13 0450  BP: 148/69  Pulse: 63  Temp: 97.8 F (36.6 C)  Resp: 20    Intake/Output Summary (Last 24 hours) at 04/06/13 1108 Last data filed at 04/06/13 2130  Gross per 24 hour  Intake    243 ml  Output    350 ml  Net   -107 ml   Filed Weights   04/05/13 1721  Weight: 90.9 kg (200 lb 6.4 oz)    Exam:   General:  Well nourished NAD  Cardiovascular: RRR No MGR No LE edema  Respiratory: normal effort BS clear bilaterally no wheeze no rhonchi  Abdomen: soft mildly distended. +BS somewhat sluggish.   Musculoskeletal: no clubbing no cyanosis   Data  Reviewed: Basic Metabolic Panel:  Recent Labs Lab 04/05/13 1353 04/06/13 0532  NA 134* 137  K 4.1 3.9  CL 99 101  CO2 26 25  GLUCOSE 101* 66*  BUN 9 9  CREATININE 0.80 0.84  CALCIUM 10.1 9.6   Liver Function Tests:  Recent Labs Lab 04/05/13 1353 04/06/13 0532  AST 262* 271*  ALT 119* 104*  ALKPHOS 440* 397*  BILITOT 4.6* 4.0*  PROT 7.5 6.8  ALBUMIN 2.8* 2.6*    Recent Labs Lab 04/05/13 1353  LIPASE 46   No results found for this basename: AMMONIA,  in the last 168 hours CBC:  Recent Labs Lab 04/05/13 1353 04/06/13 0532  WBC 5.4 5.0  NEUTROABS 3.7  --   HGB 15.0 14.1  HCT 44.5 42.2  MCV 96.7 97.5  PLT 133* 132*   Cardiac Enzymes:  Recent Labs Lab 04/05/13 1353  TROPONINI <0.30   BNP (last 3 results) No results found for this basename: PROBNP,  in the last 8760 hours CBG:  Recent Labs Lab 04/05/13 1324 04/05/13 2144 04/06/13 0716 04/06/13 0746  GLUCAP 100* 120* 55* 116*    No results found for this or any previous visit (from the past 240 hour(s)).   Studies: Dg Chest 2 View  04/05/2013   CLINICAL DATA:  Abdominal pain  EXAM: CHEST  2 VIEW  COMPARISON:  Chest radiograph 10/27/2012 and 12/05/2007  FINDINGS: Mild cardiomegaly. Normal pulmonary vascularity mediastinal contours. Atherosclerotic calcification of the thoracic aortic arch. No airspace disease. No visible pleural effusion or pneumothorax. No acute osseous abnormality.  IMPRESSION: Mild cardiomegaly.  No acute findings.   Electronically Signed   By: Vinetta Bergamo.D.  On: 04/05/2013 15:55   Ct Abdomen Pelvis W Contrast  04/05/2013   CLINICAL DATA:  Skin cancer.  Abdominal pain.  EXAM: CT ABDOMEN AND PELVIS WITH CONTRAST  TECHNIQUE: Multidetector CT imaging of the abdomen and pelvis was performed using the standard protocol following bolus administration of intravenous contrast.  CONTRAST:  50mL OMNIPAQUE IOHEXOL 300 MG/ML SOLN, OMNIPAQUE IOHEXOL 300 MG/ML SOLN  COMPARISON:   None.  FINDINGS: Diminished intravenous contrast limits the exam.  The liver is cirrhotic characterize by a nodular contour and enlargement of the left lobe. There is ill-defined infiltrating low density within the anterior segment of the right lobe of the liver measuring 15.6 x 7.4 x 10.6 cm. Underlying malignancy is suggested.  There is a filling defect occluding the right portal vein extending into the distal main portal vein. It has elevated lb Foley in a MEASUREMENTS worrisome for tumor occlusion rather than thrombus. There is poor filling of the superior mesenteric vein loculated to phase of contrast. Splenic vein is patent.  Pelvic cysts in both kidneys. Simple cysts in both kidneys within the cortex. No definite solid renal mass.  Small amount of free fluid about the liver and spleen. The spleen is normal in size.  Pancreas, adrenal glands, gallbladder are within normal limits.  Umbilical vein is recannulized, a finding associated with portal hypertension.  Several sub cm nodules are seen at the posterior lung bases. Some are calcified. There are patchy densities at the posterior lower right lung base related to pneumonitis or volume loss.  Several venous structures at the gastroesophageal junction are worrisome for a gastroesophageal varices. There is a small amount of free fluid in the pelvis.  Left inguinal hernia thinning adipose tissue.  Advanced degenerative changes in the lumbar spine. T10 wedge compression deformity of indeterminate age is present involving its superior endplate. There is 20% loss of height anteriorly no definite destructive bone lesion.  IMPRESSION: Cirrhosis.  Ascites.  Gastroesophageal varices.  Fidnikgns worrisome for hepatocellular carcinoma with tumor extension into the right portal vein resulting in occlusion.  T10 compression fracture of indeterminate age.  Bibasilar sub cm pulmonary nodules.   Electronically Signed   By: Maryclare Bean M.D.   On: 04/05/2013 15:34    Scheduled  Meds: . amLODipine  5 mg Oral Daily  . aspirin  81 mg Oral Daily  . cefTRIAXone (ROCEPHIN)  IV  2 g Intravenous Q24H  . famotidine  20 mg Oral Daily  . folic acid  1 mg Oral Daily  . heparin subcutaneous  5,000 Units Subcutaneous Q8H  . insulin aspart  0-9 Units Subcutaneous TID WC  . lisinopril  10 mg Oral Daily  . multivitamin with minerals  1 tablet Oral Daily  . sodium chloride  3 mL Intravenous Q12H  . thiamine  100 mg Oral Daily   Or  . thiamine  100 mg Intravenous Daily   Continuous Infusions:   Principal Problem:   Abdominal pain Active Problems:   Type II or unspecified type diabetes mellitus without mention of complication, not stated as uncontrolled   SBP (spontaneous bacterial peritonitis)   Cirrhosis of liver   Varices, esophageal   Thrombocytopenia, unspecified    Time spent: 30 minutes    [facility] M  [facility] Pager [phone]. If 7PM-7AM, please contact night-coverage at [email or web address], password [facility] 04/06/2013, 11:08 AM  LOS: 1 day

## 2013-04-06 NOTE — Care Management Note (Addendum)
    Page 1 of 1   04/07/2013     1:27:14 PM   CARE MANAGEMENT NOTE 04/07/2013  Patient:  Bradley Simmons, Bradley Simmons   Account Number:  [id number]  Date Initiated:  04/06/2013  Documentation initiated by:  Sharrie Rothman  Subjective/Objective Assessment:   Pt admitted from home with abd pain. Pt lives with his wife and will return at discharge. Pt has a walker for home use if needed. Pt has been independent with ADL's.     Action/Plan:   Will continue to follow for discharge planning needs.   Anticipated DC Date:  04/10/2013   Anticipated DC Plan:  HOME/SELF CARE      DC Planning Services  CM consult      Choice offered to / List presented to:             Status of service:  Completed, signed off Medicare Important Message given?  NA - LOS <3 / Initial given by admissions (If response is "NO", the following Medicare IM given date fields will be blank) Date Medicare IM given:   Date Additional Medicare IM given:    Discharge Disposition:  HOME/SELF CARE  Per UR Regulation:    If discussed at Long Length of Stay Meetings, dates discussed:    Comments:  04/07/13 1330 Arlyss Queen, RN BSN CM Pt discharged home today, No CM needs noted.  04/06/13 1530 Arlyss Queen, RN BSN CM

## 2013-04-06 NOTE — Progress Notes (Signed)
04/06/13 0910 Possible paracentesis today per report. Pt receiving heparin SQ for VTE prophylaxis, last dose given at 0500. Notified ultrasound. Okay for paracentesis today if must be done today per MD. Otherwise recommend paracentesis tomorrow after heparin held. Discussed with Toya Smothers, NP this morning. Earnstine Regal, RN

## 2013-04-06 NOTE — Progress Notes (Signed)
04/06/13 1122 Patient does not need paracentesis per ultrasound, not enough fluid to drain. Notified Toya Smothers, NP and Dr. Irene Limbo aware. Nursing to measure abdominal girth daily. Earnstine Regal, RN

## 2013-04-06 NOTE — Progress Notes (Signed)
INITIAL NUTRITION ASSESSMENT  DOCUMENTATION CODES Per approved criteria  -Not Applicable   INTERVENTION:  Recommend Low Sodium diet (<2000 mg/day)  Provide Low Sodium diet edu  RD will follow for nutrition care  NUTRITION DIAGNOSIS: Increased protein-energy needs related to liver disease as evidenced by cirrhosis/ascites.   Goal: Pt to meet >/= 90% of their estimated nutrition needs   Monitor:  Po intake, labs and wt trends  Reason for Assessment: Malnutrition Screen Score = 2  79 y.o. male  Admitting Dx: Abdominal pain  ASSESSMENT: Pt has new diagnosis of cirrhosis. Possible paracentesis today or tomorrow due to ascites. Hypoglycemia (CBG-66) earlier this morning; snack provided and rechecked (CBG-116). Regular diet with po of 100% at breakfast this morning. Difficult to assess acurate weight change due to pt ascites. At risk for malnutrition in the context of chronic disease.   Patient Active Problem List   Diagnosis Date Noted  . Abdominal pain 04/05/2013  . SBP (spontaneous bacterial peritonitis) 04/05/2013  . Cirrhosis of liver without mention of alcohol 04/05/2013  . Cirrhosis of liver 04/05/2013  . Varices, esophageal 04/05/2013  . Thrombocytopenia, unspecified 04/05/2013  . Type II or unspecified type diabetes mellitus without mention of complication, not stated as uncontrolled 10/28/2012  . Leukocytosis, unspecified 10/28/2012  . Anaphylactic shock 10/27/2012  . Hypotension 10/27/2012  . HTN (hypertension) 10/27/2012  . Elevated troponin 10/27/2012    Height: Ht Readings from Last 1 Encounters:  04/05/13 6' (1.829 m)    Weight: Wt Readings from Last 1 Encounters:  04/05/13 200 lb 6.4 oz (90.9 kg)    Ideal Body Weight: 178# (80.9 kg)  % Ideal Body Weight: 112%  Wt Readings from Last 10 Encounters:  04/05/13 200 lb 6.4 oz (90.9 kg)  11/18/12 205 lb (92.987 kg)  11/06/12 205 lb (92.987 kg)  11/05/12 205 lb (92.987 kg)  10/28/12 207 lb 7.3 oz  (94.1 kg)    Usual Body Weight: 205#  % Usual Body Weight: 98%  BMI:  Body mass index is 27.17 kg/(m^2). overweight   Estimated Nutritional Needs: Kcal: 2300-2600 Protein: 97-105 gr Fluid: per MD goals  Skin: intact  Diet Order: General  EDUCATION NEEDS: -Education needs addressed related to liver disease   Intake/Output Summary (Last 24 hours) at 04/06/13 1038 Last data filed at 04/06/13 0923  Gross per 24 hour  Intake    243 ml  Output    350 ml  Net   -107 ml    Last BM: 04/06/13 abdominal distention/ ascites  Labs:   Recent Labs Lab 04/05/13 1353 04/06/13 0532  NA 134* 137  K 4.1 3.9  CL 99 101  CO2 26 25  BUN 9 9  CREATININE 0.80 0.84  CALCIUM 10.1 9.6  GLUCOSE 101* 66*    CBG (last 3)   Recent Labs  04/05/13 2144 04/06/13 0716 04/06/13 0746  GLUCAP 120* 55* 116*    Scheduled Meds: . amLODipine  5 mg Oral Daily  . aspirin  81 mg Oral Daily  . cefTRIAXone (ROCEPHIN)  IV  2 g Intravenous Q24H  . famotidine  20 mg Oral Daily  . folic acid  1 mg Oral Daily  . heparin subcutaneous  5,000 Units Subcutaneous Q8H  . insulin aspart  0-9 Units Subcutaneous TID WC  . lisinopril  10 mg Oral Daily  . multivitamin with minerals  1 tablet Oral Daily  . sodium chloride  3 mL Intravenous Q12H  . thiamine  100 mg Oral Daily  Or  . thiamine  100 mg Intravenous Daily    Continuous Infusions:   Past Medical History  Diagnosis Date  . Basal cell carcinoma of nose ~ 2010    points to right side of nose" (November 19, 2012)  . Hypertension   . Hypercholesteremia   . Heart murmur     "found out recently" (2012/11/19)  . Type II diabetes mellitus   . Arthritis     "joints mostly" (11/19/2012)  . Kidney stone 1980's?    "passed on it's own" (2012/11/19)  . 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 Type II or unspecified type diabetes mellitus without mention of complication, not stated as uncontrolled 10/28/2012     Past Surgical History  Procedure Laterality Date  . Back surgery    . Lumbar disc surgery  1985    "scraped out calcium deposits" (11/19/12)  . Cataract extraction w/ intraocular lens implant Right 2013  . Skin cancer excision  ~ 2010    "right side of my nose" (11/19/2012)  . Yag laser application Right 03/09/2013    Procedure: YAG LASER APPLICATION;  Surgeon: Susa Simmonds, MD;  Location: [facility];  Service: Ophthalmology;  Laterality: Right;  . Colonoscopy  2003    Dr. Clent Ridges: active bleeding from pedunculated polyp, s/p polypectomy and bleeding control therapy with epi  . Colonoscopy  2003    Dr. Clent Ridges: hyperplastic polyps   . Colonoscopy  2014    Dr. Clent Ridges per patient. Results not available 04/06/13.     Royann Shivers MS,RD,CSG,LDN Office: [phone] Pager: [phone]

## 2013-04-06 NOTE — Consult Note (Addendum)
Referring Provider: Dr. Irene Limbo Primary Care Physician:  Dr. Rudi Heap Primary Gastroenterologist:  Dr. Matthias Hughs  Date of Admission: 04/05/13 Date of Consultation: 04/06/13  Reason for Consultation:  Cirrhosis, possible HCC  HPI:  Bradley Simmons is an 79 year old male who presented to the ED with abdominal pain and found to have elevated LFTs, new finding of cirrhosis on CT and concern for [facility] due to ill-defined infiltrating low density within anterior segment of right lobe of liver, with a filling defect occluding right portal vein worrisome for tumor occlusion versus thrombus.   Notes abdominal pain for one week, diffusely. Intermittent, better with moving around. Worsened in the evening and at night with rest. Feels like it was worse when "I didn't eat". No nausea or vomiting. Appetite waning in the past week. Worsening reflux this past week specifically but also notes intermittent reflux in the past. Zantac prn. No rectal bleeding, no melena. No constipation, diarrhea. No changes in bowel habits. Denies any jaundice, pruritis. Denies confusion, memory loss, mental status changes.   Feels like abdomen is "tight". Abdominal pain improved since admission. Denies any prior history of liver disease. Fatty liver on ultrasound in 2009.    Last colonoscopy by [facility] this year per patient with tubular adenomas. No prior EGD.   Past Medical History  Diagnosis Date  . Basal cell carcinoma of nose ~ 2010    points to right side of nose" (11/26/2012)  . Hypertension   . Hypercholesteremia   . Heart murmur     "found out recently" (2012/11/26)  . Type II diabetes mellitus   . Arthritis     "joints mostly" (2012-11-26)  . Kidney stone 1980's?    "passed on it's own" (11-26-2012)  . 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 Type II or unspecified type diabetes mellitus without mention of complication, not stated as uncontrolled 10/28/2012    Past  Surgical History  Procedure Laterality Date  . Back surgery    . Lumbar disc surgery  1985    "scraped out calcium deposits" (26-Nov-2012)  . Cataract extraction w/ intraocular lens implant Right 2013  . Skin cancer excision  ~ 2010    "right side of my nose" (11/26/12)  . Yag laser application Right 03/09/2013    Procedure: YAG LASER APPLICATION;  Surgeon: Susa Simmonds, MD;  Location: [facility];  Service: Ophthalmology;  Laterality: Right;  . Colonoscopy  2003    Dr. Matthias Hughs: active bleeding from pedunculated polyp, s/p polypectomy and bleeding control therapy with epi  . Colonoscopy  2003    Dr. Matthias Hughs: hyperplastic polyps   . Colonoscopy  March 2014    Dr. Matthias Hughs: tubular adenomas (only path report available in epic, op report not available at time of consult on 12/1.     Prior to Admission medications   Medication Sig Start Date End Date Taking? Authorizing Provider  amLODipine (NORVASC) 5 MG tablet Take 1 tablet (5 mg total) by mouth daily. Resume tomorrow 10/29/12. 11/27/12  Yes Coralie Keens, FNP  aspirin 81 MG chewable tablet Chew 81 mg by mouth daily.   Yes Historical Provider, MD  atorvastatin (LIPITOR) 10 MG tablet Take 1 tablet (10 mg total) by mouth daily. 11/18/12  Yes Mary-Margaret Daphine Deutscher, FNP  Bismuth Subsalicylate (PEPTO-BISMOL PO) Take 10 mLs by mouth daily as needed (heart burn).   Yes Historical Provider, MD  glipiZIDE (GLIPIZIDE XL) 5 MG 24 hr tablet Take 1 tablet (  5 mg total) by mouth daily. 11/18/12  Yes Mary-Margaret Daphine Deutscher, FNP  lisinopril (PRINIVIL,ZESTRIL) 10 MG tablet Take 10 mg by mouth daily.   Yes Historical Provider, MD  metFORMIN (GLUCOPHAGE) 1000 MG tablet Take 1 tablet (1,000 mg total) by mouth daily with breakfast. 11/18/12  Yes Mary-Margaret Daphine Deutscher, FNP  Multiple Vitamin (MULTIVITAMIN WITH MINERALS) TABS Take 1 tablet by mouth daily.   Yes Historical Provider, MD  ranitidine (ZANTAC) 150 MG tablet Take 150 mg by mouth daily as needed for heartburn.    Yes Historical Provider, MD  EPINEPHrine (EPI-PEN) 0.3 mg/0.3 mL SOAJ Inject 0.3 mLs (0.3 mg total) into the muscle once. 10/30/12   Mary-Margaret Daphine Deutscher, FNP  ONE TOUCH ULTRA TEST test strip USE TO CHECK BLOOD SUGAR TWICE DAILY 02/19/13   Ernestina Penna, MD  [facility] DELICA LANCETS 33G MISC 1 Stick by Does not apply route QID. 03/04/13   Mary-Margaret Daphine Deutscher, FNP    Current Facility-Administered Medications  Medication Dose Route Frequency Provider Last Rate Last Dose  . 0.9 %  sodium chloride infusion  250 mL Intravenous PRN Standley Brooking, MD      . amLODipine (NORVASC) tablet 5 mg  5 mg Oral Daily Standley Brooking, MD   5 mg at 04/06/13 [phone]  . aspirin chewable tablet 81 mg  81 mg Oral Daily Standley Brooking, MD      . cefTRIAXone (ROCEPHIN) 2 g in dextrose 5 % 50 mL IVPB  2 g Intravenous Q24H Standley Brooking, MD   2 g at 04/05/13 2009  . famotidine (PEPCID) tablet 20 mg  20 mg Oral Daily Standley Brooking, MD   20 mg at 04/06/13 8413  . folic acid (FOLVITE) tablet 1 mg  1 mg Oral Daily Standley Brooking, MD   1 mg at 04/06/13 0918  . heparin injection 5,000 Units  5,000 Units Subcutaneous Q8H Standley Brooking, MD   5,000 Units at 04/06/13 0500  . HYDROmorphone (DILAUDID) injection 0.5 mg  0.5 mg Intravenous Q4H PRN Standley Brooking, MD   0.5 mg at 04/06/13 0036  . insulin aspart (novoLOG) injection 0-9 Units  0-9 Units Subcutaneous TID WC Standley Brooking, MD      . lisinopril (PRINIVIL,ZESTRIL) tablet 10 mg  10 mg Oral Daily Standley Brooking, MD   10 mg at 04/06/13 2440  . LORazepam (ATIVAN) tablet 1 mg  1 mg Oral Q6H PRN Standley Brooking, MD       Or  . LORazepam (ATIVAN) injection 1 mg  1 mg Intravenous Q6H PRN Standley Brooking, MD      . multivitamin with minerals tablet 1 tablet  1 tablet Oral Daily Standley Brooking, MD   1 tablet at 04/06/13 [phone]  . ondansetron (ZOFRAN) tablet 4 mg  4 mg Oral Q6H PRN Standley Brooking, MD       Or  . ondansetron [facility]) injection 4 mg   4 mg Intravenous Q6H PRN Standley Brooking, MD      . oxyCODONE (Oxy IR/ROXICODONE) immediate release tablet 5 mg  5 mg Oral Q4H PRN Standley Brooking, MD      . sodium chloride 0.9 % injection 3 mL  3 mL Intravenous Q12H Standley Brooking, MD   3 mL at 04/06/13 2536  . sodium chloride 0.9 % injection 3 mL  3 mL Intravenous PRN Standley Brooking, MD      . thiamine (VITAMIN B-1) tablet 100 mg  100 mg Oral Daily Standley Brooking, MD   100 mg at 04/06/13 2440   Or  . thiamine (B-1) injection 100 mg  100 mg Intravenous Daily Standley Brooking, MD        Allergies as of 04/05/2013 - Review Complete 04/05/2013  Allergen Reaction Noted  . Bee venom Anaphylaxis and Swelling 02/20/2013    Family History  Problem Relation Age of Onset  . Cancer Mother     sinus  . Colon cancer Neg Hx   . Liver cancer Neg Hx     History   Social History  . Marital Status: Married    Spouse Name: N/A    Number of Children: N/A  . Years of Education: N/A   Occupational History  . Not on file.   Social History Main Topics  . Smoking status: Former Smoker -- 2 years    Quit date: 11/18/1992  . Smokeless tobacco: Never Used  . Alcohol Use: 3.6 oz/week    6 Cans of beer per week     Comment: 10/27/2012 "have used alot of alcohol". usually 1/5 a week.   . Drug Use: No  . Sexual Activity: Yes   Other Topics Concern  . Not on file   Social History Narrative  . No narrative on file    Review of Systems: As mentioned in HPI  Physical Exam: Vital signs in last 24 hours: Temp:  [97.7 F (36.5 C)-98.3 F (36.8 C)] 97.8 F (36.6 C) (12/01 0450) Pulse Rate:  [59-105] 63 (12/01 0450) Resp:  [18-20] 20 (12/01 0450) BP: (124-155)/(68-80) 148/69 mmHg (12/01 0450) SpO2:  [92 %-100 %] 92 % (12/01 0450) Weight:  [200 lb 6.4 oz (90.9 kg)] 200 lb 6.4 oz (90.9 kg) (11/30 1721) Last BM Date: 04/06/13 General:   Alert,  Well-developed, well-nourished, pleasant and cooperative in NAD, appears mildly  jaundiced. Head:  Normocephalic and atraumatic Ears:  Normal auditory acuity. Nose:  No deformity, discharge,  or lesions. Mouth:  No deformity or lesions, dentition normal. Neck:  Supple; no masses or thyromegaly. Lungs:  Clear throughout to auscultation.   No wheezes, crackles, or rhonchi. No acute distress. Heart:  Regular rate and rhythm; systolic murmur appreciated Abdomen:  Distended but still soft, +BS, no rebound or guarding. Difficult to appreciate HSM due to large AP diameter Rectal:  Deferred   Msk:  Symmetrical without gross deformities. Normal posture. Pulses:  Normal pulses noted. Extremities:  Without clubbing or edema. Neurologic:  Alert and  oriented x4;  grossly normal neurologically. Skin:  Intact without significant lesions or rashes. Cervical Nodes:  No significant cervical adenopathy. Psych:  Alert and cooperative. Normal mood and affect.  Intake/Output from previous day: 11/30 0701 - 12/01 0700 In: -  Out: 350 [Urine:350] Intake/Output this shift: Total I/O In: 243 [P.O.:240; I.V.:3] Out: -   Lab Results:  Recent Labs  04/05/13 1353 04/06/13 0532  WBC 5.4 5.0  HGB 15.0 14.1  HCT 44.5 42.2  PLT 133* 132*   BMET  Recent Labs  04/05/13 1353 04/06/13 0532  NA 134* 137  K 4.1 3.9  CL 99 101  CO2 26 25  GLUCOSE 101* 66*  BUN 9 9  CREATININE 0.80 0.84  CALCIUM 10.1 9.6   LFT  Recent Labs  04/05/13 1353 04/06/13 0532  PROT 7.5 6.8  ALBUMIN 2.8* 2.6*  AST 262* 271*  ALT 119* 104*  ALKPHOS 440* 397*  BILITOT 4.6* 4.0*    Studies/Results: Dg Chest 2  View  04/05/2013   CLINICAL DATA:  Abdominal pain  EXAM: CHEST  2 VIEW  COMPARISON:  Chest radiograph 10/27/2012 and 12/05/2007  FINDINGS: Mild cardiomegaly. Normal pulmonary vascularity mediastinal contours. Atherosclerotic calcification of the thoracic aortic arch. No airspace disease. No visible pleural effusion or pneumothorax. No acute osseous abnormality.  IMPRESSION: Mild  cardiomegaly.  No acute findings.   Electronically Signed   By: Britta Mccreedy M.D.   On: 04/05/2013 15:55   Ct Abdomen Pelvis W Contrast  04/05/2013   CLINICAL DATA:  Skin cancer.  Abdominal pain.  EXAM: CT ABDOMEN AND PELVIS WITH CONTRAST  TECHNIQUE: Multidetector CT imaging of the abdomen and pelvis was performed using the standard protocol following bolus administration of intravenous contrast.  CONTRAST:  50mL OMNIPAQUE IOHEXOL 300 MG/ML SOLN, OMNIPAQUE IOHEXOL 300 MG/ML SOLN  COMPARISON:  None.  FINDINGS: Diminished intravenous contrast limits the exam.  The liver is cirrhotic characterize by a nodular contour and enlargement of the left lobe. There is ill-defined infiltrating low density within the anterior segment of the right lobe of the liver measuring 15.6 x 7.4 x 10.6 cm. Underlying malignancy is suggested.  There is a filling defect occluding the right portal vein extending into the distal main portal vein. It has elevated lb Foley in a MEASUREMENTS worrisome for tumor occlusion rather than thrombus. There is poor filling of the superior mesenteric vein loculated to phase of contrast. Splenic vein is patent.  Pelvic cysts in both kidneys. Simple cysts in both kidneys within the cortex. No definite solid renal mass.  Small amount of free fluid about the liver and spleen. The spleen is normal in size.  Pancreas, adrenal glands, gallbladder are within normal limits.  Umbilical vein is recannulized, a finding associated with portal hypertension.  Several sub cm nodules are seen at the posterior lung bases. Some are calcified. There are patchy densities at the posterior lower right lung base related to pneumonitis or volume loss.  Several venous structures at the gastroesophageal junction are worrisome for a gastroesophageal varices. There is a small amount of free fluid in the pelvis.  Left inguinal hernia thinning adipose tissue.  Advanced degenerative changes in the lumbar spine. T10 wedge  compression deformity of indeterminate age is present involving its superior endplate. There is 20% loss of height anteriorly no definite destructive bone lesion.  IMPRESSION: Cirrhosis.  Ascites.  Gastroesophageal varices.  Fidnikgns worrisome for hepatocellular carcinoma with tumor extension into the right portal vein resulting in occlusion.  T10 compression fracture of indeterminate age.  Bibasilar sub cm pulmonary nodules.   Electronically Signed   By: Maryclare Bean M.D.   On: 04/05/2013 15:34   US Abdomen Limited  04/06/2013   CLINICAL DATA:  Ascites, assessment for paracentesis  EXAM: LIMITED ABDOMEN ULTRASOUND FOR ASCITES  TECHNIQUE: Limited ultrasound survey for ascites was performed in all four abdominal quadrants.  COMPARISON:  CT abdomen and pelvis 04/05/2013  FINDINGS: Small amounts of ascites are identified perihepatic, perisplenic, and in the pelvis.  Multiple bowel loops are seen at the pelvic collections in the lower quadrants bilaterally, limiting access.  No sufficient pocket of accessible fluid is identified.  IMPRESSION: Small amount of ascites.  No accessible pockets of significant fluid are identified.   Electronically Signed   By: Ulyses Southward M.D.   On: 04/06/2013 11:39    Impression: 79 year old male admitted with abdominal pain and found to have newly diagnosed cirrhosis, concern for hepatocellular carcinoma as evidenced on CT, concern for  portal vein occlusion from possible tumor, elevated transaminases. Abdominal pain improved and likely not dealing with SBP. 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 Needs Hep A and B vaccination if not already immune.   [name] of abdomen without sufficient amount of ascites for paracentesis. Agree with empiric antibiotics for now, which have already been started.   I discussed with Dr. Tyron Russell, who recommends an MRI with and without contrast to further sort out CT findings. Will need Oncology input in near future likely; will await MRI findings prior  to consulting.   Plan: MRI with and without contrast today Change Pepcid to Protonix once daily Continue Rocephin for now Outpatient EGD for variceal assessment, which can be done with Dr. Matthias Hughs.  Follow-up on pending AFP tumor marker Check viral markers, Hep A and B vaccination status, INR, iron studies now  Nira Retort, ANP-BC [facility]       LOS: 1 day    04/06/2013, 11:59 AM  Attending note:  Pt seen and examined this evening .  MRI reviewed - Pt most likely has multifocal HCC with partial portal vein thrombosis.  Agree with limited cirrhosis work-up as outlined.  Oncology consult, AFP pending.

## 2013-04-06 NOTE — Progress Notes (Signed)
Patient seen, independently examined and chart reviewed. I agree with exam, assessment and plan discussed with Toya Smothers, NP.  Afebrile, vital signs stable. No hypoxia. Hypoglycemic this morning resolved with treatment. There was not enough fluid for paracentesis sampling. Chemistry panel, CBC without significant change.  He feels better today. Pain is controlled. None issues.  Appreciate gastroenterology recommendations. MRI pending, continue antibiotics for now although SBP is doubted. Followup MRI and gastroenterology recommendations. Anticipate discharge 12/2 if pain is controlled.  Brendia Sacks, MD [facility] [phone]

## 2013-04-06 NOTE — Progress Notes (Signed)
UR chart review completed.  

## 2013-04-07 ENCOUNTER — Inpatient Hospital Stay (HOSPITAL_COMMUNITY): Payer: Medicare Other

## 2013-04-07 ENCOUNTER — Other Ambulatory Visit: Payer: Self-pay | Admitting: Internal Medicine

## 2013-04-07 DIAGNOSIS — I81 Portal vein thrombosis: Secondary | ICD-10-CM

## 2013-04-07 DIAGNOSIS — C228 Malignant neoplasm of liver, primary, unspecified as to type: Secondary | ICD-10-CM

## 2013-04-07 DIAGNOSIS — R188 Other ascites: Secondary | ICD-10-CM

## 2013-04-07 DIAGNOSIS — K7689 Other specified diseases of liver: Secondary | ICD-10-CM

## 2013-04-07 DIAGNOSIS — C220 Liver cell carcinoma: Secondary | ICD-10-CM

## 2013-04-07 LAB — BASIC METABOLIC PANEL
BUN: 10 mg/dL (ref 6–23)
CO2: 29 mEq/L (ref 19–32)
Calcium: 9.6 mg/dL (ref 8.4–10.5)
Creatinine, Ser: 0.89 mg/dL (ref 0.50–1.35)
GFR calc Af Amer: >90 mL/min (ref >90)
Glucose, Bld: 62 mg/dL — ABNORMAL LOW (ref 70–99)
Sodium: 138 mEq/L (ref 135–145)

## 2013-04-07 LAB — GLUCOSE, CAPILLARY
Glucose-Capillary: 66 mg/dL — ABNORMAL LOW (ref 70–99)
Glucose-Capillary: 124 mg/dL — ABNORMAL HIGH (ref 70–99)
Glucose-Capillary: 235 mg/dL — ABNORMAL HIGH (ref 70–99)

## 2013-04-07 LAB — FERRITIN: Ferritin: 934 ng/mL — ABNORMAL HIGH (ref 22–322)

## 2013-04-07 LAB — HEMOGLOBIN A1C: Hgb A1c MFr Bld: 6.9 % — ABNORMAL HIGH (ref <5.7)

## 2013-04-07 LAB — HEPATITIS PANEL, ACUTE
Hep B C IgM: NONREACTIVE
Hepatitis B Surface Ag: NEGATIVE

## 2013-04-07 LAB — HEPATITIS B SURFACE ANTIBODY,QUALITATIVE: Hep B S Ab: NEGATIVE

## 2013-04-07 LAB — TRANSFERRIN: Transferrin: 183 mg/dL — ABNORMAL LOW (ref 200–360)

## 2013-04-07 MED ORDER — OXYCODONE HCL 5 MG PO TABS: 5.0000 mg | ORAL_TABLET | ORAL | Status: DC | PRN

## 2013-04-07 MED ORDER — PANTOPRAZOLE SODIUM 40 MG PO TBEC: 40.0000 mg | DELAYED_RELEASE_TABLET | Freq: Every day | ORAL | Status: AC

## 2013-04-07 MED ORDER — PANTOPRAZOLE SODIUM 40 MG PO TBEC
40.0000 mg | DELAYED_RELEASE_TABLET | Freq: Every day | ORAL | Status: DC
  Administered 2013-04-07: 40 mg via ORAL
  Filled 2013-04-07: qty 1

## 2013-04-07 NOTE — Discharge Summary (Signed)
Patient seen, independently examined and chart reviewed. I agree with exam, assessment and plan discussed with Toya Smothers, NP.  Feels well today. No abdominal pain. Wants to go home.  Afebrile, vital signs stable. Appears calm and comfortable. Abdomen distended but nontender. Insufficient ascites for paracentesis.  Case was discussed with oncology and radiology previously. Thrombosis of portal vein is believed to be tumor in origin. Regardless per oncology the patient is not an anticoagulation candidate secondary to high risk for bleeding because of his liver disease and varices. Outpatient followup with interventional radiology has been recommended for consideration of chemoembolization. Outpatient followup with oncology will be coordinated. Discussed with GI medicine, clinical concern for SBP is negligible. Recommended stopping antibiotics. He is now stable for discharge.  Brendia Sacks, MD [facility] [phone]

## 2013-04-07 NOTE — Discharge Summary (Signed)
Physician Discharge Summary  Bradley Simmons WGN:[id number] DOB: 1929/05/15 DOA: 04/05/2013  PCP: Rudi Heap, MD  Admit date: 04/05/2013 Discharge date: 04/07/2013  Time spent: 55 minutes minutes  Recommendations for Outpatient Follow-up:  1. Dr. Grace Isaac with Interventional radiology 04/09/13 for consideration chemo embolization 2. Follow up with Dr. Ardine Eng oncology 04/21/13  Discharge Diagnoses:  Principal Problem:  Liver masses   Hepatocellular carcinoma Cirrhosis of liver   Varices, esophageal   Abdominal pain Active Problems:   Type II or unspecified type diabetes mellitus without mention of complication, not stated as uncontrolled   SBP (spontaneous bacterial peritonitis)     Thrombocytopenia, unspecified   Discharge Condition: stable  Diet recommendation: regular  Filed Weights   04/05/13 1721  Weight: 90.9 kg (200 lb 6.4 oz)    History of present illness:  79 year old man presented to the emergency department on 11/30 with abdominal pain. Initial evaluation was notable for evidence of liver failure, cirrhosis, gastroesophageal varices, probable hepatocellular carcinoma all new findings.  Patient reported abdominal pain for one to 2 weeks prior  which had been progressing, it was located in the lower abdomen. No associated nausea or vomiting. Described as an ache. Maximum intensity 8/10. Because of continued pain without improvement he came to the emergency department for further evaluation. He has a history of significant alcohol use in the past and still drinks 2-3 beers per day.  In the emergency department he was afebrile with stable vital signs. Complete metabolic panel notable for elevated transaminases, total bilirubin and alkaline phosphatase. CBC unremarkable. Urinalysis negative. Chest x-ray negative. CT abdomen ascites,g astroesophageal varices. worrisome for hepatocellular carcinoma with tumor extension into the right portal vein resulting in occlusion.       Hospital Course:  New diagnosis cirrhosis with ascites, gastroesophageal varices with findings worrisome for hepatocellular carcinoma. MRI abdomen yields hepatic cirrhosis, with dominant 10 cm mass in the right hepatic lobe. Numerous other small nodules in the posterior segment of the right hepatic lobe are suspicious for multifocal hepatoma. There is no definite tumor involvement of the left hepatic lobe. Right portal vein thrombosis, likely due tumor thrombus. No other sites of abdominal metastatic disease identified. Mild ascites, and upper abdominal varices consistent with portal venous hypertension. Evaluated for paracentesis but not enough fluid to draw. Evaluated by GI who opined EGD on OP basis for gastroesophageal varices assessment  would suffice. Patient does have primary GI Dr. Matthias Hughs. Recommended PPI for prophylaxis and Hep B vaccine.  Was on antibiotic for some concern of SBP. No evidence of this so antibiotic discontinued at discharge. Pt also seen by oncology who opined probable HCC and recommended IR consult for consideration of chemo-embolization of liver lesion vs. Therespheres. Of note, oncology impression that due to his cirrhosis of liver, systemic chemotherapeutic intervention is not an option at this time but that his performance status is excellent and there may be opportunity for palliative intervention.      Abdominal pain: Lower abdomen. Likely secondary to the underlying malignancy and cirrhosis. See #1. Initially SBP a concern and was given empiric antibiotics. Unable to get paracentesis due to not enough fluid. Pain improved markedly. At discharge pt tolerating diet. Will discharge with pain medicine. Follow up as above.    Thrombocytopenia: Mild. Secondary to cirrhosis. No s/sx bleeding. OP follow up   Hypertension: Remained stable during hospitalization. Continue lisinopril.   Type 2 diabetes mellitus: CBG range 116-120. Inially home oral medications held.  resumed on discharge.    alcohol abuse,  possible dependence: no s/sx withdrawal during this hospitalization.      Procedures:  none  Consultations:  Oncology  GI  Discharge Exam: Filed Vitals:   04/07/13 1157  BP: 128/56  Pulse: 62  Temp: 97.7 F (36.5 C)  Resp: 20    General: calm comfortable NAD Cardiovascular: RRR No MGR No LE edema Respiratory: normal effort BS clear bilaterally no wheeze Abdomen: distended, +BS non-tender   Discharge Instructions     Medication List         amLODipine 5 MG tablet  Commonly known as:  NORVASC  Take 1 tablet (5 mg total) by mouth daily. Resume tomorrow 10/29/12.     aspirin 81 MG chewable tablet  Chew 81 mg by mouth daily.     atorvastatin 10 MG tablet  Commonly known as:  LIPITOR  Take 1 tablet (10 mg total) by mouth daily.     EPINEPHrine 0.3 mg/0.3 mL Soaj injection  Commonly known as:  EPI-PEN  Inject 0.3 mLs (0.3 mg total) into the muscle once.     glipiZIDE 5 MG 24 hr tablet  Commonly known as:  GLIPIZIDE XL  Take 1 tablet (5 mg total) by mouth daily.     lisinopril 10 MG tablet  Commonly known as:  PRINIVIL,ZESTRIL  Take 10 mg by mouth daily.     metFORMIN 1000 MG tablet  Commonly known as:  GLUCOPHAGE  Take 1 tablet (1,000 mg total) by mouth daily with breakfast.     multivitamin with minerals Tabs tablet  Take 1 tablet by mouth daily.     ONE TOUCH ULTRA TEST test strip  Generic drug:  glucose blood  USE TO CHECK BLOOD SUGAR TWICE DAILY     ONETOUCH DELICA LANCETS 33G Misc  1 Stick by Does not apply route QID.     oxyCODONE 5 MG immediate release tablet  Commonly known as:  Oxy IR/ROXICODONE  Take 1 tablet (5 mg total) by mouth every 4 (four) hours as needed for moderate pain.     pantoprazole 40 MG tablet  Commonly known as:  PROTONIX  Take 1 tablet (40 mg total) by mouth daily.     PEPTO-BISMOL PO  Take 10 mLs by mouth daily as needed (heart burn).     ranitidine 150 MG tablet   Commonly known as:  ZANTAC  Take 150 mg by mouth daily as needed for heartburn.       Allergies  Allergen Reactions  . Bee Venom Anaphylaxis and Swelling   Follow-up Information   Follow up with [facility]. Schedule an appointment as soon as possible for a visit in 10 days. (Make appt with MD in 7-14 days from discharge before patient is discharged from hospital)    Contact information:   [address] [place] [address]       Follow up with Interventional Radiology On 04/09/2013. (has appointment with Dr watts. arrive 8:45am. if questions call [phone])    Contact information:   [address] [facility] [address] on [address] [place]       The results of significant diagnostics from this hospitalization (including imaging, microbiology, ancillary and laboratory) are listed below for reference.    Significant Diagnostic Studies: Dg Chest 2 View  04/05/2013   CLINICAL DATA:  Abdominal pain  EXAM: CHEST  2 VIEW  COMPARISON:  Chest radiograph 10/27/2012 and 12/05/2007  FINDINGS: Mild cardiomegaly. Normal pulmonary vascularity mediastinal contours. Atherosclerotic calcification  of the thoracic aortic arch. No airspace disease. No visible pleural effusion or pneumothorax. No acute osseous abnormality.  IMPRESSION: Mild cardiomegaly.  No acute findings.   Electronically Signed   By: Britta Mccreedy M.D.   On: 04/05/2013 15:55   Mr Abdomen W Wo Contrast  04/06/2013   CLINICAL DATA:  Abdominal pain. Hepatic cirrhosis. Liver mass seen on CT.  EXAM: MRI ABDOMEN WITHOUT AND WITH CONTRAST  TECHNIQUE: Multiplanar multisequence MR imaging of the abdomen was performed both before and after the administration of intravenous contrast.  CONTRAST:  10 cc Eovist  COMPARISON:  CT on 04/05/2013  FINDINGS: Hepatic cirrhosis is demonstrated. Thrombosis of the right portal vein is demonstrated. A dominant mass in the right hepatic lobe measures 7 x 10  cm on image 36 of series 8. The right hepatic lobe shows diffusely heterogeneous signal with numerous other small nodules particularly in the posterior segment of the right hepatic lobe, which show T2 hyperintensity and lack of Eovist retention on delayed imaging, suspicious for multifocal hepatoma. No definite masses are seen involving the left hepatic lobe or caudate lobe.  Mild ascites is noted. No evidence of splenomegaly. Varices are seen in the gastrohepatic ligament, consistent with portal venous hypertension. The gallbladder is unremarkable. Shotty less than 1 cm lymph nodes are seen porta hepatis, none of which are pathologically enlarged. No pathologically enlarged nodes are visualized within the abdomen.  The pancreas, adrenal glands, and kidneys are normal in appearance. No evidence of hydronephrosis. Benign hemangioma incidentally noted in the lower thoracic spine.  IMPRESSION: Hepatic cirrhosis, with dominant 10 cm mass in the right hepatic lobe. Numerous other small nodules in the posterior segment of the right hepatic lobe are suspicious for multifocal hepatoma. There is no definite tumor involvement of the left hepatic lobe.  Right portal vein thrombosis, likely due tumor thrombus. No other sites of abdominal metastatic disease identified.  Mild ascites, and upper abdominal varices consistent with portal venous hypertension.   Electronically Signed   By: Myles Rosenthal M.D.   On: 04/06/2013 14:55   Ct Abdomen Pelvis W Contrast  04/05/2013   CLINICAL DATA:  Skin cancer.  Abdominal pain.  EXAM: CT ABDOMEN AND PELVIS WITH CONTRAST  TECHNIQUE: Multidetector CT imaging of the abdomen and pelvis was performed using the standard protocol following bolus administration of intravenous contrast.  CONTRAST:  50mL OMNIPAQUE IOHEXOL 300 MG/ML SOLN, OMNIPAQUE IOHEXOL 300 MG/ML SOLN  COMPARISON:  None.  FINDINGS: Diminished intravenous contrast limits the exam.  The liver is cirrhotic characterize by a  nodular contour and enlargement of the left lobe. There is ill-defined infiltrating low density within the anterior segment of the right lobe of the liver measuring 15.6 x 7.4 x 10.6 cm. Underlying malignancy is suggested.  There is a filling defect occluding the right portal vein extending into the distal main portal vein. It has elevated lb Foley in a MEASUREMENTS worrisome for tumor occlusion rather than thrombus. There is poor filling of the superior mesenteric vein loculated to phase of contrast. Splenic vein is patent.  Pelvic cysts in both kidneys. Simple cysts in both kidneys within the cortex. No definite solid renal mass.  Small amount of free fluid about the liver and spleen. The spleen is normal in size.  Pancreas, adrenal glands, gallbladder are within normal limits.  Umbilical vein is recannulized, a finding associated with portal hypertension.  Several sub cm nodules are seen at the posterior lung bases. Some are calcified. There are patchy  densities at the posterior lower right lung base related to pneumonitis or volume loss.  Several venous structures at the gastroesophageal junction are worrisome for a gastroesophageal varices. There is a small amount of free fluid in the pelvis.  Left inguinal hernia thinning adipose tissue.  Advanced degenerative changes in the lumbar spine. T10 wedge compression deformity of indeterminate age is present involving its superior endplate. There is 20% loss of height anteriorly no definite destructive bone lesion.  IMPRESSION: Cirrhosis.  Ascites.  Gastroesophageal varices.  Fidnikgns worrisome for hepatocellular carcinoma with tumor extension into the right portal vein resulting in occlusion.  T10 compression fracture of indeterminate age.  Bibasilar sub cm pulmonary nodules.   Electronically Signed   By: Maryclare Bean M.D.   On: 04/05/2013 15:34   US Abdomen Limited  04/07/2013   CLINICAL DATA:  Cirrhotic liver with hepatic mass question hepatoma, ascites, assess  for paracentesis  EXAM: LIMITED ABDOMEN ULTRASOUND FOR ASCITES  TECHNIQUE: Limited ultrasound survey for ascites was performed in all four abdominal quadrants.  COMPARISON:  04/06/2013  FINDINGS: Small amount of ascites is seen at the right pericolic gutter/right lower quadrant.  Numerous bowel loops are present within this small collection of ascites. Amount of ascites is insufficient for paracentesis with no adequate pocket identified to safely allow for the performance of the procedure.  IMPRESSION: Small amount of ascites in the right lower quadrant, insufficient for paracentesis.   Electronically Signed   By: Ulyses Southward M.D.   On: 04/07/2013 11:20   US Abdomen Limited  04/06/2013   CLINICAL DATA:  Ascites, assessment for paracentesis  EXAM: LIMITED ABDOMEN ULTRASOUND FOR ASCITES  TECHNIQUE: Limited ultrasound survey for ascites was performed in all four abdominal quadrants.  COMPARISON:  CT abdomen and pelvis 04/05/2013  FINDINGS: Small amounts of ascites are identified perihepatic, perisplenic, and in the pelvis.  Multiple bowel loops are seen at the pelvic collections in the lower quadrants bilaterally, limiting access.  No sufficient pocket of accessible fluid is identified.  IMPRESSION: Small amount of ascites.  No accessible pockets of significant fluid are identified.   Electronically Signed   By: Ulyses Southward M.D.   On: 04/06/2013 11:39    Microbiology: No results found for this or any previous visit (from the past 240 hour(s)).   Labs: Basic Metabolic Panel:  Recent Labs Lab 04/05/13 1353 04/06/13 0532 04/07/13 0535  NA 134* 137 138  K 4.1 3.9 4.2  CL 99 101 101  CO2 26 25 29   GLUCOSE 101* 66* 62*  BUN 9 9 10   CREATININE 0.80 0.84 0.89  CALCIUM 10.1 9.6 9.6   Liver Function Tests:  Recent Labs Lab 04/05/13 1353 04/06/13 0532  AST 262* 271*  ALT 119* 104*  ALKPHOS 440* 397*  BILITOT 4.6* 4.0*  PROT 7.5 6.8  ALBUMIN 2.8* 2.6*    Recent Labs Lab 04/05/13 1353   LIPASE 46   No results found for this basename: AMMONIA,  in the last 168 hours CBC:  Recent Labs Lab 04/05/13 1353 04/06/13 0532  WBC 5.4 5.0  NEUTROABS 3.7  --   HGB 15.0 14.1  HCT 44.5 42.2  MCV 96.7 97.5  PLT 133* 132*   Cardiac Enzymes:  Recent Labs Lab 04/05/13 1353  TROPONINI <0.30   BNP: BNP (last 3 results) No results found for this basename: PROBNP,  in the last 8760 hours CBG:  Recent Labs Lab 04/06/13 1634 04/06/13 2052 04/07/13 0738 04/07/13 0851 04/07/13 1135  GLUCAP 181*  141* 66* 124* 235*       Signed:  BLACK,KAREN M  [facility] 04/07/2013, 12:16 PM

## 2013-04-07 NOTE — Consult Note (Signed)
[facility] [place] Consultation Oncology  Name: Bradley Simmons      MRN: [id number]    Location: A315/A315-01  Date: 04/07/2013 Time:8:54 AM   REFERRING PHYSICIAN:  Brendia Sacks, MD  REASON FOR CONSULT:  Liver lesion(s)   DIAGNOSIS:  Probable primary HCC  HISTORY OF PRESENT ILLNESS:   Bradley Simmons is a pleasant 79 year old Caucasian man who reported to Bradley ED with a 1 week history of abdominal pain.  Bradley Simmons presented to [facility] ED on 04/05/2013 with a 1 week history of abdominal pain.  On discussion Bradley AM, he reports that he was taking Tylenol to control his discomfort at home.  He denies any LE edema and bowel changes.  He notes darkening of his urine as well.  He denies any jaundice or yellowing of his skin and eyes, he denies any mental status changes.  He also denies any difficulty with speaking or slurring words.    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  He has not family history of cirrhosis of liver of cancers.  He denies a history of IV drug abuse or a diagnosis of hepatitis in Bradley past.   I personally reviewed and went over radiographic studies with Bradley Simmons.  His MRI of abdomen follows below, but Bradley test illustrates hepatic cirrhosis with a dominant 10 cm mass in Bradley right hepatic lobe with numerous small nodules.  Additionally, there is a right portal vein thrombosis.  I personally reviewed and went over laboratory results with Bradley Simmons.  Bradley Simmons's AFP is noted to be > 200,000.  Bradley Simmons clinically has primary HCC.  Due to his cirrhosis of liver, systemic chemotherapeutic intervention is not an option at Bradley time.  His performance status is excellent and therefore we have an opportunity for palliative intervention to hopefully extend life.  Bradley Simmons may be a candidate for chemoembolization versus therospheres vs external beam radiation potentially.  He does have contraindication to  chemoembolization due to right portal vein thrombosis, but Bradley may not be an absolute contraindication.  We will defer to interventional radiology.  Therefore, we recommend an outpatient referral to interventional radiology.    PAST MEDICAL HISTORY:   Past Medical History  Diagnosis Date  . Basal cell carcinoma of nose ~ 2010    points to right side of nose" (11-24-12)  . Hypertension   . Hypercholesteremia   . Heart murmur     "found out recently" (11/24/2012)  . Type II diabetes mellitus   . Arthritis     "joints mostly" (2012-11-24)  . Kidney stone 1980's?    "passed on it's own" (2012/11/24)  . Anaphylactic reaction November 24, 2012    "stung by a mess of yellow jackets" (2012/11/24)  . Type II or unspecified type diabetes mellitus without mention of complication, not stated as uncontrolled 10/28/2012    ALLERGIES: Allergies  Allergen Reactions  . Bee Venom Anaphylaxis and Swelling      MEDICATIONS: I have reviewed Bradley Simmons's current medications.     PAST SURGICAL HISTORY Past Surgical History  Procedure Laterality Date  . Back surgery    . Lumbar disc surgery  1985    "scraped out calcium deposits" (24-Nov-2012)  . Cataract extraction w/ intraocular lens implant Right 2013  . Skin cancer excision  ~ 2010    "right side of my nose" (2012/11/24)  . Yag laser application Right 03/09/2013    Procedure:  YAG LASER APPLICATION;  Surgeon: Susa Simmonds, MD;  Location: [facility];  Service: Ophthalmology;  Laterality: Right;  . Colonoscopy  2003    Dr. Matthias Hughs: active bleeding from pedunculated polyp, s/p polypectomy and bleeding control therapy with epi  . Colonoscopy  2003    Dr. Matthias Hughs: hyperplastic polyps   . Colonoscopy  March 2014    Dr. Matthias Hughs: tubular adenomas (only path report available in epic, op report not available at time of consult on 12/1.     FAMILY HISTORY: Family History  Problem Relation Age of Onset  . Cancer Mother     sinus  . Colon cancer Neg Hx   .  Liver cancer Neg Hx     SOCIAL HISTORY:  reports that he quit smoking about 20 years ago. He has never used smokeless tobacco. He reports that he drinks about 3.6 ounces of alcohol per week. He reports that he does not use illicit drugs.  PERFORMANCE STATUS: Bradley Simmons's performance status is 1 - Symptomatic but completely ambulatory  PHYSICAL EXAM: Most Recent Vital Signs: Blood pressure 138/69, pulse 65, temperature 98.2 F (36.8 C), temperature source Oral, resp. rate 19, height 6' (1.829 m), weight 200 lb 6.4 oz (90.9 kg), SpO2 95.00%. General appearance: alert, cooperative, appears stated age and no distress Head: Normocephalic, without obvious abnormality, atraumatic Eyes: negative findings: conjunctivae and sclerae normal Neck: supple, symmetrical, trachea midline Abdomen: abnormal findings:  ascites, distended and shifting dullness Extremities: extremities normal, atraumatic, no cyanosis or edema Skin: Skin color, texture, turgor normal. No rashes or lesions Neurologic: Grossly normal  LABORATORY DATA:  Results for orders placed during Bradley hospital encounter of 04/05/13 (from Bradley past 48 hour(s))  GLUCOSE, CAPILLARY     Status: Abnormal   Collection Time    04/05/13  1:24 PM      Result Value Range   Glucose-Capillary 100 (*) 70 - 99 mg/dL  CBC WITH DIFFERENTIAL     Status: Abnormal   Collection Time    04/05/13  1:53 PM      Result Value Range   WBC 5.4  4.0 - 10.5 K/uL   RBC 4.60  4.22 - 5.81 MIL/uL   Hemoglobin 15.0  13.0 - 17.0 g/dL   HCT 16.1  09.6 - 04.5 %   MCV 96.7  78.0 - 100.0 fL   MCH 32.6  26.0 - 34.0 pg   MCHC 33.7  30.0 - 36.0 g/dL   RDW 40.9  81.1 - 91.4 %   Platelets 133 (*) 150 - 400 K/uL   Neutrophils Relative % 69  43 - 77 %   Neutro Abs 3.7  1.7 - 7.7 K/uL   Lymphocytes Relative 15  12 - 46 %   Lymphs Abs 0.8  0.7 - 4.0 K/uL   Monocytes Relative 14 (*) 3 - 12 %   Monocytes Absolute 0.7  0.1 - 1.0 K/uL   Eosinophils Relative 2  0 - 5 %    Eosinophils Absolute 0.1  0.0 - 0.7 K/uL   Basophils Relative 0  0 - 1 %   Basophils Absolute 0.0  0.0 - 0.1 K/uL  COMPREHENSIVE METABOLIC PANEL     Status: Abnormal   Collection Time    04/05/13  1:53 PM      Result Value Range   Sodium 134 (*) 135 - 145 mEq/L   Potassium 4.1  3.5 - 5.1 mEq/L   Chloride 99  96 - 112 mEq/L   CO2 26  19 - 32 mEq/L   Glucose, Bld 101 (*) 70 - 99 mg/dL   BUN 9  6 - 23 mg/dL   Creatinine, Ser 6.57  0.50 - 1.35 mg/dL   Calcium 84.6  8.4 - 96.2 mg/dL   Total Protein 7.5  6.0 - 8.3 g/dL   Albumin 2.8 (*) 3.5 - 5.2 g/dL   AST 952 (*) 0 - 37 U/L   ALT 119 (*) 0 - 53 U/L   Alkaline Phosphatase 440 (*) 39 - 117 U/L   Total Bilirubin 4.6 (*) 0.3 - 1.2 mg/dL   GFR calc non Af Amer 82 (*) >90 mL/min   GFR calc Af Amer >90  >90 mL/min   Comment: (NOTE)     Bradley eGFR has been calculated using Bradley CKD EPI equation.     Bradley calculation has not been validated in all clinical situations.     eGFR's persistently <90 mL/min signify possible Chronic Kidney     Disease.  LIPASE, BLOOD     Status: None   Collection Time    04/05/13  1:53 PM      Result Value Range   Lipase 46  11 - 59 U/L  LACTIC ACID, PLASMA     Status: Abnormal   Collection Time    04/05/13  1:53 PM      Result Value Range   Lactic Acid, Venous 2.4 (*) 0.5 - 2.2 mmol/L  TROPONIN I     Status: None   Collection Time    04/05/13  1:53 PM      Result Value Range   Troponin I <0.30  <0.30 ng/mL   Comment:            Due to Bradley release kinetics of cTnI,     a negative result within Bradley first hours     of Bradley onset of symptoms does not rule out     myocardial infarction with certainty.     If myocardial infarction is still suspected,     repeat Bradley test at appropriate intervals.  URINALYSIS W MICROSCOPIC + REFLEX CULTURE     Status: Abnormal   Collection Time    04/05/13  2:35 PM      Result Value Range   Color, Urine YELLOW  YELLOW   APPearance CLEAR  CLEAR   Specific Gravity, Urine  <1.005 (*) 1.005 - 1.030   pH 6.0  5.0 - 8.0   Glucose, UA NEGATIVE  NEGATIVE mg/dL   Hgb urine dipstick NEGATIVE  NEGATIVE   Bilirubin Urine SMALL (*) NEGATIVE   Ketones, ur NEGATIVE  NEGATIVE mg/dL   Protein, ur NEGATIVE  NEGATIVE mg/dL   Urobilinogen, UA 2.0 (*) 0.0 - 1.0 mg/dL   Nitrite NEGATIVE  NEGATIVE   Leukocytes, UA NEGATIVE  NEGATIVE   WBC, UA 0-2  <3 WBC/hpf   RBC / HPF 0-2  <3 RBC/hpf   Bacteria, UA RARE  RARE  GLUCOSE, CAPILLARY     Status: Abnormal   Collection Time    04/05/13  9:44 PM      Result Value Range   Glucose-Capillary 120 (*) 70 - 99 mg/dL   Comment 1 Notify RN    CBC     Status: Abnormal   Collection Time    04/06/13  5:32 AM      Result Value Range   WBC 5.0  4.0 - 10.5 K/uL   RBC 4.33  4.22 - 5.81 MIL/uL   Hemoglobin 14.1  13.0 - 17.0 g/dL   HCT 02.7  25.3 - 66.4 %   MCV 97.5  78.0 - 100.0 fL   MCH 32.6  26.0 - 34.0 pg   MCHC 33.4  30.0 - 36.0 g/dL   RDW 40.3  47.4 - 25.9 %   Platelets 132 (*) 150 - 400 K/uL  COMPREHENSIVE METABOLIC PANEL     Status: Abnormal   Collection Time    04/06/13  5:32 AM      Result Value Range   Sodium 137  135 - 145 mEq/L   Potassium 3.9  3.5 - 5.1 mEq/L   Chloride 101  96 - 112 mEq/L   CO2 25  19 - 32 mEq/L   Glucose, Bld 66 (*) 70 - 99 mg/dL   BUN 9  6 - 23 mg/dL   Creatinine, Ser 5.63  0.50 - 1.35 mg/dL   Calcium 9.6  8.4 - 87.5 mg/dL   Total Protein 6.8  6.0 - 8.3 g/dL   Albumin 2.6 (*) 3.5 - 5.2 g/dL   AST 643 (*) 0 - 37 U/L   ALT 104 (*) 0 - 53 U/L   Alkaline Phosphatase 397 (*) 39 - 117 U/L   Total Bilirubin 4.0 (*) 0.3 - 1.2 mg/dL   GFR calc non Af Amer 80 (*) >90 mL/min   GFR calc Af Amer >90  >90 mL/min   Comment: (NOTE)     Bradley eGFR has been calculated using Bradley CKD EPI equation.     Bradley calculation has not been validated in all clinical situations.     eGFR's persistently <90 mL/min signify possible Chronic Kidney     Disease.  AFP TUMOR MARKER     Status: Abnormal   Collection Time     04/06/13  5:32 AM      Result Value Range   AFP-Tumor Marker >200000.0 (*) 0.0 - 8.0 ng/mL   Comment: (NOTE)     Result repeated and verified.     Result confirmed by automatic dilution.     Bradley Advia Centaur AFP immunoassay method is used.  Results obtained     with different assay methods or kits cannot be used interchangeably.     AFP is a valuable aid in Bradley management of nonseminomatous testicular     cancer patients when used in conjunction with information available     from Bradley clinical evaluation and other diagnostic procedures.     Increased serum AFP concentrations have also been observed in ataxia     telangiectasia, hereditary tyrosinemia, primary hepatocellular     carcinoma, teratocarcinoma, gastrointestinal tract cancers with and     without liver metastases, and in benign hepatic conditions such as     acute viral hepatitis, chronic active hepatitis, and cirrhosis.  Bradley     result cannot be interpreted as absolute evidence of Bradley presence or     absence of malignant disease.  Bradley result is not interpretable in     pregnant females.     Performed at [facility]  GLUCOSE, CAPILLARY     Status: Abnormal   Collection Time    04/06/13  7:16 AM      Result Value Range   Glucose-Capillary 55 (*) 70 - 99 mg/dL   Comment 1 Documented in Chart     Comment 2 Notify RN    GLUCOSE, CAPILLARY     Status: Abnormal   Collection Time    04/06/13  7:46 AM  Result Value Range   Glucose-Capillary 116 (*) 70 - 99 mg/dL   Comment 1 Documented in Chart     Comment 2 Notify RN    GLUCOSE, CAPILLARY     Status: Abnormal   Collection Time    04/06/13 12:21 PM      Result Value Range   Glucose-Capillary 181 (*) 70 - 99 mg/dL   Comment 1 Documented in Chart     Comment 2 Notify RN    HEPATITIS PANEL, ACUTE     Status: None   Collection Time    04/06/13  2:38 PM      Result Value Range   Hepatitis B Surface Ag NEGATIVE  NEGATIVE   HCV Ab NEGATIVE  NEGATIVE   Hep A  IgM NON REACTIVE  NON REACTIVE   Hep B C IgM NON REACTIVE  NON REACTIVE   Comment: (NOTE)     High levels of Hepatitis B Core IgM antibody are detectable     during Bradley acute stage of Hepatitis B. Bradley antibody is used     to differentiate current from past HBV infection.     Performed at [facility]  HEPATITIS B SURFACE ANTIBODY     Status: None   Collection Time    04/06/13  2:38 PM      Result Value Range   Hep B S Ab NEGATIVE  NEGATIVE   Comment: Performed at [facility]  HEPATITIS A ANTIBODY, TOTAL     Status: Abnormal   Collection Time    04/06/13  2:38 PM      Result Value Range   Hep A Total Ab Reactive (*) NON REACTIVE   Comment: Performed at [facility]  PROTIME-INR     Status: None   Collection Time    04/06/13  2:38 PM      Result Value Range   Prothrombin Time 14.8  11.6 - 15.2 seconds   INR 1.19  0.00 - 1.49  IRON AND TIBC     Status: Abnormal   Collection Time    04/06/13  2:40 PM      Result Value Range   Iron 34 (*) 42 - 135 ug/dL   TIBC 119  147 - 829 ug/dL   Saturation Ratios 14 (*) 20 - 55 %   UIBC 207  125 - 400 ug/dL   Comment: Performed at [facility]  FERRITIN     Status: Abnormal   Collection Time    04/06/13  2:40 PM      Result Value Range   Ferritin 934 (*) 22 - 322 ng/mL   Comment: Performed at [facility]  TRANSFERRIN     Status: Abnormal   Collection Time    04/06/13  2:40 PM      Result Value Range   Transferrin 183 (*) 200 - 360 mg/dL   Comment: Performed at [facility]  GLUCOSE, CAPILLARY     Status: Abnormal   Collection Time    04/06/13  4:34 PM      Result Value Range   Glucose-Capillary 181 (*) 70 - 99 mg/dL   Comment 1 Documented in Chart     Comment 2 Notify RN    GLUCOSE, CAPILLARY     Status: Abnormal   Collection Time    04/06/13  8:52 PM      Result Value Range   Glucose-Capillary 141 (*) 70 - 99 mg/dL   Comment 1 Notify  RN    BASIC METABOLIC PANEL     Status:  Abnormal   Collection Time    04/07/13  5:35 AM      Result Value Range   Sodium 138  135 - 145 mEq/L   Potassium 4.2  3.5 - 5.1 mEq/L   Chloride 101  96 - 112 mEq/L   CO2 29  19 - 32 mEq/L   Glucose, Bld 62 (*) 70 - 99 mg/dL   BUN 10  6 - 23 mg/dL   Creatinine, Ser 1.61  0.50 - 1.35 mg/dL   Calcium 9.6  8.4 - 09.6 mg/dL   GFR calc non Af Amer 78 (*) >90 mL/min   GFR calc Af Amer >90  >90 mL/min   Comment: (NOTE)     Bradley eGFR has been calculated using Bradley CKD EPI equation.     Bradley calculation has not been validated in all clinical situations.     eGFR's persistently <90 mL/min signify possible Chronic Kidney     Disease.  GLUCOSE, CAPILLARY     Status: Abnormal   Collection Time    04/07/13  7:38 AM      Result Value Range   Glucose-Capillary 66 (*) 70 - 99 mg/dL   Comment 1 Notify RN        RADIOGRAPHY: Dg Chest 2 View  04/05/2013   CLINICAL DATA:  Abdominal pain  EXAM: CHEST  2 VIEW  COMPARISON:  Chest radiograph 10/27/2012 and 12/05/2007  FINDINGS: Mild cardiomegaly. Normal pulmonary vascularity mediastinal contours. Atherosclerotic calcification of Bradley thoracic aortic arch. No airspace disease. No visible pleural effusion or pneumothorax. No acute osseous abnormality.  IMPRESSION: Mild cardiomegaly.  No acute findings.   Electronically Signed   By: Britta Mccreedy M.D.   On: 04/05/2013 15:55   Mr Abdomen W Wo Contrast  04/06/2013   CLINICAL DATA:  Abdominal pain. Hepatic cirrhosis. Liver mass seen on CT.  EXAM: MRI ABDOMEN WITHOUT AND WITH CONTRAST  TECHNIQUE: Multiplanar multisequence MR imaging of Bradley abdomen was performed both before and after Bradley administration of intravenous contrast.  CONTRAST:  10 cc Eovist  COMPARISON:  CT on 04/05/2013  FINDINGS: Hepatic cirrhosis is demonstrated. Thrombosis of Bradley right portal vein is demonstrated. A dominant mass in Bradley right hepatic lobe measures 7 x 10 cm on image 36 of series 8. Bradley right hepatic lobe shows diffusely heterogeneous  signal with numerous other small nodules particularly in Bradley posterior segment of Bradley right hepatic lobe, which show T2 hyperintensity and lack of Eovist retention on delayed imaging, suspicious for multifocal hepatoma. No definite masses are seen involving Bradley left hepatic lobe or caudate lobe.  Mild ascites is noted. No evidence of splenomegaly. Varices are seen in Bradley gastrohepatic ligament, consistent with portal venous hypertension. Bradley gallbladder is unremarkable. Shotty less than 1 cm lymph nodes are seen porta hepatis, none of which are pathologically enlarged. No pathologically enlarged nodes are visualized within Bradley abdomen.  Bradley pancreas, adrenal glands, and kidneys are normal in appearance. No evidence of hydronephrosis. Benign hemangioma incidentally noted in Bradley lower thoracic spine.  IMPRESSION: Hepatic cirrhosis, with dominant 10 cm mass in Bradley right hepatic lobe. Numerous other small nodules in Bradley posterior segment of Bradley right hepatic lobe are suspicious for multifocal hepatoma. There is no definite tumor involvement of Bradley left hepatic lobe.  Right portal vein thrombosis, likely due tumor thrombus. No other sites of abdominal metastatic disease identified.  Mild ascites, and upper abdominal varices consistent  with portal venous hypertension.   Electronically Signed   By: Myles Rosenthal M.D.   On: 04/06/2013 14:55   Ct Abdomen Pelvis W Contrast  04/05/2013   CLINICAL DATA:  Skin cancer.  Abdominal pain.  EXAM: CT ABDOMEN AND PELVIS WITH CONTRAST  TECHNIQUE: Multidetector CT imaging of Bradley abdomen and pelvis was performed using Bradley standard protocol following bolus administration of intravenous contrast.  CONTRAST:  50mL OMNIPAQUE IOHEXOL 300 MG/ML SOLN, OMNIPAQUE IOHEXOL 300 MG/ML SOLN  COMPARISON:  None.  FINDINGS: Diminished intravenous contrast limits Bradley exam.  Bradley liver is cirrhotic characterize by a nodular contour and enlargement of Bradley left lobe. There is ill-defined infiltrating  low density within Bradley anterior segment of Bradley right lobe of Bradley liver measuring 15.6 x 7.4 x 10.6 cm. Underlying malignancy is suggested.  There is a filling defect occluding Bradley right portal vein extending into Bradley distal main portal vein. It has elevated lb Foley in a MEASUREMENTS worrisome for tumor occlusion rather than thrombus. There is poor filling of Bradley superior mesenteric vein loculated to phase of contrast. Splenic vein is patent.  Pelvic cysts in both kidneys. Simple cysts in both kidneys within Bradley cortex. No definite solid renal mass.  Small amount of free fluid about Bradley liver and spleen. Bradley spleen is normal in size.  Pancreas, adrenal glands, gallbladder are within normal limits.  Umbilical vein is recannulized, a finding associated with portal hypertension.  Several sub cm nodules are seen at Bradley posterior lung bases. Some are calcified. There are patchy densities at Bradley posterior lower right lung base related to pneumonitis or volume loss.  Several venous structures at Bradley gastroesophageal junction are worrisome for a gastroesophageal varices. There is a small amount of free fluid in Bradley pelvis.  Left inguinal hernia thinning adipose tissue.  Advanced degenerative changes in Bradley lumbar spine. T10 wedge compression deformity of indeterminate age is present involving its superior endplate. There is 20% loss of height anteriorly no definite destructive bone lesion.  IMPRESSION: Cirrhosis.  Ascites.  Gastroesophageal varices.  Fidnikgns worrisome for hepatocellular carcinoma with tumor extension into Bradley right portal vein resulting in occlusion.  T10 compression fracture of indeterminate age.  Bibasilar sub cm pulmonary nodules.   Electronically Signed   By: Maryclare Bean M.D.   On: 04/05/2013 15:34   US Abdomen Limited  04/06/2013   CLINICAL DATA:  Ascites, assessment for paracentesis  EXAM: LIMITED ABDOMEN ULTRASOUND FOR ASCITES  TECHNIQUE: Limited ultrasound survey for ascites was performed in all  four abdominal quadrants.  COMPARISON:  CT abdomen and pelvis 04/05/2013  FINDINGS: Small amounts of ascites are identified perihepatic, perisplenic, and in Bradley pelvis.  Multiple bowel loops are seen at Bradley pelvic collections in Bradley lower quadrants bilaterally, limiting access.  No sufficient pocket of accessible fluid is identified.  IMPRESSION: Small amount of ascites.  No accessible pockets of significant fluid are identified.   Electronically Signed   By: Ulyses Southward M.D.   On: 04/06/2013 11:39       ASSESSMENT:  1. 10 cm liver lesion with other small liver lesions noted on MRI abdomen.  2. Ascites 3. Abdominal pain 4. AFP elevation, >200,000 5. Probable primary HCC 6. Cirrhosis of liver 7. Right portal vein thrombosis 8. History of EtOHism, drinking bourbon in past 9. DM 10. Esophageal varices 11. Thrombocytopenia, secondary to #1, #5, #6 12. Transaminitis, secondary to #5.   PLAN:  1. I personally reviewed and went over laboratory results with  Bradley Simmons. 2. I personally reviewed and went over radiographic studies with Bradley Simmons. 3. [name] abd limited to evaluate extend of ascites.  I understand one was performed yesterday and there was not enough fluid to consider paracentesis, but I am not able to compare today to yesterday clinically.  4. Diagnostic and therapeutic paracentesis if safe to perform.  Please send fluid for cytology, cell count, and gram staining. 5. Recommend outpatient referral to Interventional radiology at time of discharge for consideration of chemo-embolization of liver lesion(s) versus therospheres.  Simmons may have contraindication to Bradley procedure(s) as he has a right portal vein thrombosis, but I will defer to interventionalist whether Bradley is an absolute contraindication. 6. Recommend discharge with non-Tylenol containing pain medication (ie Oxycodone HCL or Dilaudid).   7. Return to [facility] for follow-up in ~10 days.  All questions  were answered. Bradley Simmons knows to call Bradley clinic with any problems, questions or concerns. We can certainly see Bradley Simmons much sooner if necessary.  Simmons and plan discussed with Dr. Alla German and he is in agreement with Bradley aforementioned.    KEFALAS,THOMAS   Attending:  Patient's performance is remarkably good considering Bradley extent of his disease. 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 TACEwould offer Bradley best chance of palliation even though portal vein thrombus is noted. Will repeat ultrasound of Bradley abdomen since there appears to be tight ascites in an effort to provide palliation by paracentesis. Analgesics without Tylenol.referral to interventional radiology for consideration of TACE with or without radiofrequency ablation.

## 2013-04-07 NOTE — Progress Notes (Signed)
Subjective: Mild abdominal discomfort, feels more distended than yesterday. No N/V, tolerating diet.   Objective: Vital signs in last 24 hours: Temp:  [98.1 F (36.7 C)-98.4 F (36.9 C)] 98.2 F (36.8 C) (12/02 0423) Pulse Rate:  [55-72] 65 (12/02 0423) Resp:  [18-19] 19 (12/02 0423) BP: (129-145)/(69-76) 138/69 mmHg (12/02 0423) SpO2:  [94 %-96 %] 95 % (12/02 0423) Last BM Date: 04/06/13 General:   Alert and oriented, pleasant Head:  Normocephalic and atraumatic. Eyes:  No icterus, sclera clear. Conjuctiva pink.  Mouth:  Without lesions, mucosa pink and moist.  Neck:  Supple, without thyromegaly or masses.  Heart:  S1, S2 present, systolic murmur appreciated. Lungs: Clear to auscultation bilaterally, without wheezing, rales, or rhonchi.  Abdomen:  Bowel sounds present, objectively more distended from yesterday,but still soft, non-tender. Difficult to appreciate HSM due to large AP diameter.  Extremities:  Without clubbing or edema. Neurologic:  Alert and  oriented x4;  grossly normal neurologically.   Intake/Output from previous day: 12/01 0701 - 12/02 0700 In: 893 [P.O.:840; I.V.:3; IV Piggyback:50] Out: -  Intake/Output this shift:    Lab Results:  Recent Labs  04/05/13 1353 04/06/13 0532  WBC 5.4 5.0  HGB 15.0 14.1  HCT 44.5 42.2  PLT 133* 132*   BMET  Recent Labs  04/05/13 1353 04/06/13 0532 04/07/13 0535  NA 134* 137 138  K 4.1 3.9 4.2  CL 99 101 101  CO2 26 25 29   GLUCOSE 101* 66* 62*  BUN 9 9 10   CREATININE 0.80 0.84 0.89  CALCIUM 10.1 9.6 9.6   LFT  Recent Labs  04/05/13 1353 04/06/13 0532  PROT 7.5 6.8  ALBUMIN 2.8* 2.6*  AST 262* 271*  ALT 119* 104*  ALKPHOS 440* 397*  BILITOT 4.6* 4.0*   PT/INR  Recent Labs  04/06/13 1438  LABPROT 14.8  INR 1.19   Hepatitis Panel  Recent Labs  04/06/13 1438  HEPBSAG NEGATIVE  HCVAB NEGATIVE  HEPAIGM NON REACTIVE  HEPBIGM NON REACTIVE   Lab Results  Component Value Date   IRON 34*  04/06/2013   TIBC 241 04/06/2013   FERRITIN 934* 04/06/2013   Lab Results  Component Value Date   INR 1.19 04/06/2013     Studies/Results: Dg Chest 2 View  04/05/2013   CLINICAL DATA:  Abdominal pain  EXAM: CHEST  2 VIEW  COMPARISON:  Chest radiograph 10/27/2012 and 12/05/2007  FINDINGS: Mild cardiomegaly. Normal pulmonary vascularity mediastinal contours. Atherosclerotic calcification of the thoracic aortic arch. No airspace disease. No visible pleural effusion or pneumothorax. No acute osseous abnormality.  IMPRESSION: Mild cardiomegaly.  No acute findings.   Electronically Signed   By: Britta Mccreedy M.D.   On: 04/05/2013 15:55   Mr Abdomen W Wo Contrast  04/06/2013   CLINICAL DATA:  Abdominal pain. Hepatic cirrhosis. Liver mass seen on CT.  EXAM: MRI ABDOMEN WITHOUT AND WITH CONTRAST  TECHNIQUE: Multiplanar multisequence MR imaging of the abdomen was performed both before and after the administration of intravenous contrast.  CONTRAST:  10 cc Eovist  COMPARISON:  CT on 04/05/2013  FINDINGS: Hepatic cirrhosis is demonstrated. Thrombosis of the right portal vein is demonstrated. A dominant mass in the right hepatic lobe measures 7 x 10 cm on image 36 of series 8. The right hepatic lobe shows diffusely heterogeneous signal with numerous other small nodules particularly in the posterior segment of the right hepatic lobe, which show T2 hyperintensity and lack of Eovist retention on delayed imaging, suspicious for  multifocal hepatoma. No definite masses are seen involving the left hepatic lobe or caudate lobe.  Mild ascites is noted. No evidence of splenomegaly. Varices are seen in the gastrohepatic ligament, consistent with portal venous hypertension. The gallbladder is unremarkable. Shotty less than 1 cm lymph nodes are seen porta hepatis, none of which are pathologically enlarged. No pathologically enlarged nodes are visualized within the abdomen.  The pancreas, adrenal glands, and kidneys are normal in  appearance. No evidence of hydronephrosis. Benign hemangioma incidentally noted in the lower thoracic spine.  IMPRESSION: Hepatic cirrhosis, with dominant 10 cm mass in the right hepatic lobe. Numerous other small nodules in the posterior segment of the right hepatic lobe are suspicious for multifocal hepatoma. There is no definite tumor involvement of the left hepatic lobe.  Right portal vein thrombosis, likely due tumor thrombus. No other sites of abdominal metastatic disease identified.  Mild ascites, and upper abdominal varices consistent with portal venous hypertension.   Electronically Signed   By: Myles Rosenthal M.D.   On: 04/06/2013 14:55   Ct Abdomen Pelvis W Contrast  04/05/2013   CLINICAL DATA:  Skin cancer.  Abdominal pain.  EXAM: CT ABDOMEN AND PELVIS WITH CONTRAST  TECHNIQUE: Multidetector CT imaging of the abdomen and pelvis was performed using the standard protocol following bolus administration of intravenous contrast.  CONTRAST:  50mL OMNIPAQUE IOHEXOL 300 MG/ML SOLN, OMNIPAQUE IOHEXOL 300 MG/ML SOLN  COMPARISON:  None.  FINDINGS: Diminished intravenous contrast limits the exam.  The liver is cirrhotic characterize by a nodular contour and enlargement of the left lobe. There is ill-defined infiltrating low density within the anterior segment of the right lobe of the liver measuring 15.6 x 7.4 x 10.6 cm. Underlying malignancy is suggested.  There is a filling defect occluding the right portal vein extending into the distal main portal vein. It has elevated lb Foley in a MEASUREMENTS worrisome for tumor occlusion rather than thrombus. There is poor filling of the superior mesenteric vein loculated to phase of contrast. Splenic vein is patent.  Pelvic cysts in both kidneys. Simple cysts in both kidneys within the cortex. No definite solid renal mass.  Small amount of free fluid about the liver and spleen. The spleen is normal in size.  Pancreas, adrenal glands, gallbladder are within normal  limits.  Umbilical vein is recannulized, a finding associated with portal hypertension.  Several sub cm nodules are seen at the posterior lung bases. Some are calcified. There are patchy densities at the posterior lower right lung base related to pneumonitis or volume loss.  Several venous structures at the gastroesophageal junction are worrisome for a gastroesophageal varices. There is a small amount of free fluid in the pelvis.  Left inguinal hernia thinning adipose tissue.  Advanced degenerative changes in the lumbar spine. T10 wedge compression deformity of indeterminate age is present involving its superior endplate. There is 20% loss of height anteriorly no definite destructive bone lesion.  IMPRESSION: Cirrhosis.  Ascites.  Gastroesophageal varices.  Fidnikgns worrisome for hepatocellular carcinoma with tumor extension into the right portal vein resulting in occlusion.  T10 compression fracture of indeterminate age.  Bibasilar sub cm pulmonary nodules.   Electronically Signed   By: Maryclare Bean M.D.   On: 04/05/2013 15:34   US Abdomen Limited  04/06/2013   CLINICAL DATA:  Ascites, assessment for paracentesis  EXAM: LIMITED ABDOMEN ULTRASOUND FOR ASCITES  TECHNIQUE: Limited ultrasound survey for ascites was performed in all four abdominal quadrants.  COMPARISON:  CT abdomen  and pelvis 04/05/2013  FINDINGS: Small amounts of ascites are identified perihepatic, perisplenic, and in the pelvis.  Multiple bowel loops are seen at the pelvic collections in the lower quadrants bilaterally, limiting access.  No sufficient pocket of accessible fluid is identified.  IMPRESSION: Small amount of ascites.  No accessible pockets of significant fluid are identified.   Electronically Signed   By: Ulyses Southward M.D.   On: 04/06/2013 11:39    Assessment: 79 year old male with new findings of cirrhosis, likely multifocal HCC with partial portal vein thrombosis, AFP tumor marker significantly elevated. Viral markers negative.  Cirrhosis likely secondary to NASH. Objectively, patient is slightly more distended from yesterday; discussed with oncology, who recommends repeat US today to evaluate for possible paracentesis.   From a GI standpoint, will discuss with Dr. Darrick Penna an EGD as outpatient to assess for esophageal varices. Patient does have a primary GI, Dr. Matthias Hughs, that could perform this. Appreciate the consultation and recommendations by Oncology service. Recommend Hepatitis B vaccination as outpatient; patient immune to Hep A. We will follow peripherally for now.     Plan: PPI for GI prophylaxis Hep B vaccination as outpatient Likely outpatient EGD for variceal assessment: Dr. Matthias Hughs is primary GI in [facility] Further management per Oncology Repeat US of abdomen ordered today Follow peripherally for now   Nira Retort, ANP-BC [facility]    LOS: 2 days    04/07/2013, 8:17 AM

## 2013-04-07 NOTE — Progress Notes (Signed)
REVIEWED.  

## 2013-04-07 NOTE — Progress Notes (Signed)
04/07/13 1403 Reviewed discharge instructions with patient, wife at bedside. Given copy of instructions, prescription, f/u appointments as scheduled. Reviewed medication list. Pt verbalizes understanding of instructions, when to f/u and when to call MD. IV site d/c'd and within normal limits. No c/o discomfort/pain at this time. Pt in stable condition awaiting discharge home. Earnstine Regal, RN

## 2013-04-09 ENCOUNTER — Ambulatory Visit
Admission: RE | Admit: 2013-04-09 | Discharge: 2013-04-09 | Disposition: A | Discharge status: Home / Self Care | Payer: Medicare Other | Source: Ambulatory Visit | Attending: Internal Medicine | Admitting: Internal Medicine

## 2013-04-09 DIAGNOSIS — C220 Liver cell carcinoma: Secondary | ICD-10-CM

## 2013-04-21 ENCOUNTER — Encounter (HOSPITAL_BASED_OUTPATIENT_CLINIC_OR_DEPARTMENT_OTHER): Payer: Medicare Other

## 2013-04-21 ENCOUNTER — Encounter (HOSPITAL_COMMUNITY): Payer: Self-pay

## 2013-04-21 ENCOUNTER — Encounter (HOSPITAL_COMMUNITY): Payer: Medicare Other | Attending: Hematology and Oncology

## 2013-04-21 VITALS — BP 181/83 | HR 106 | Temp 97.7°F | Resp 16 | Ht 72.0 in | Wt 213.2 lb

## 2013-04-21 DIAGNOSIS — C228 Malignant neoplasm of liver, primary, unspecified as to type: Secondary | ICD-10-CM

## 2013-04-21 DIAGNOSIS — C220 Liver cell carcinoma: Secondary | ICD-10-CM

## 2013-04-21 DIAGNOSIS — K7460 Unspecified cirrhosis of liver: Secondary | ICD-10-CM

## 2013-04-21 DIAGNOSIS — R1011 Right upper quadrant pain: Secondary | ICD-10-CM

## 2013-04-21 DIAGNOSIS — M7989 Other specified soft tissue disorders: Secondary | ICD-10-CM

## 2013-04-21 LAB — COMPREHENSIVE METABOLIC PANEL
ALT: 140 U/L — ABNORMAL HIGH (ref 0–53)
AST: 444 U/L — ABNORMAL HIGH (ref 0–37)
Albumin: 2.5 g/dL — ABNORMAL LOW (ref 3.5–5.2)
Alkaline Phosphatase: 599 U/L — ABNORMAL HIGH (ref 39–117)
Calcium: 9.7 mg/dL (ref 8.4–10.5)
Creatinine, Ser: 1 mg/dL (ref 0.50–1.35)
GFR calc Af Amer: 79 mL/min — ABNORMAL LOW (ref >90)
Glucose, Bld: 127 mg/dL — ABNORMAL HIGH (ref 70–99)
Potassium: 4.5 mEq/L (ref 3.5–5.1)
Sodium: 128 mEq/L — ABNORMAL LOW (ref 135–145)
Total Protein: 7.4 g/dL (ref 6.0–8.3)

## 2013-04-21 LAB — CBC WITH DIFFERENTIAL/PLATELET
Basophils Absolute: 0 10*3/uL (ref 0.0–0.1)
Eosinophils Relative: 1 % (ref 0–5)
Hemoglobin: 16.3 g/dL (ref 13.0–17.0)
Lymphocytes Relative: 12 % (ref 12–46)
Lymphs Abs: 0.9 10*3/uL (ref 0.7–4.0)
MCV: 94.7 fL (ref 78.0–100.0)
Monocytes Absolute: 0.9 10*3/uL (ref 0.1–1.0)
Neutro Abs: 5.6 10*3/uL (ref 1.7–7.7)
Neutrophils Relative %: 76 % (ref 43–77)
Platelets: 160 10*3/uL (ref 150–400)
RBC: 4.94 MIL/uL (ref 4.22–5.81)
RDW: 16.1 % — ABNORMAL HIGH (ref 11.5–15.5)
WBC: 7.4 10*3/uL (ref 4.0–10.5)

## 2013-04-21 LAB — PROTIME-INR
INR: 1.2 (ref 0.00–1.49)
Prothrombin Time: 14.9 seconds (ref 11.6–15.2)

## 2013-04-21 LAB — RETICULOCYTES
RBC.: 4.94 MIL/uL (ref 4.22–5.81)
Retic Count, Absolute: 217.4 10*3/uL — ABNORMAL HIGH (ref 19.0–186.0)
Retic Ct Pct: 4.4 % — ABNORMAL HIGH (ref 0.4–3.1)

## 2013-04-21 LAB — APTT: aPTT: 37 seconds (ref 24–37)

## 2013-04-21 LAB — AMMONIA: Ammonia: 46 umol/L (ref 11–60)

## 2013-04-21 MED ORDER — SPIRONOLACTONE 50 MG PO TABS: ORAL_TABLET | ORAL | Status: AC

## 2013-04-21 MED ORDER — FUROSEMIDE 40 MG PO TABS: 40.0000 mg | ORAL_TABLET | Freq: Every morning | ORAL | Status: AC

## 2013-04-21 MED ORDER — SORAFENIB TOSYLATE 200 MG PO TABS: ORAL_TABLET | ORAL | Status: AC

## 2013-04-21 MED ORDER — OXYCODONE HCL 5 MG PO TABS: 5.0000 mg | ORAL_TABLET | ORAL | Status: AC | PRN

## 2013-04-21 NOTE — Progress Notes (Signed)
Labs drawn today for ammonia,cbc/diff,cmp,afp,retic,pt/ptt

## 2013-04-21 NOTE — Progress Notes (Signed)
[facility]  OFFICE PROGRESS NOTE  [place], Dorinda Hill, MD [address] [place] [address]  DIAGNOSIS: Cancer, hepatocellular - Plan: CBC with Differential, Reticulocytes, Comprehensive metabolic panel, Ammonia, AFP tumor marker, CBC with Differential, Reticulocytes, Comprehensive metabolic panel, Ammonia, AFP tumor marker  Cirrhosis of liver - Plan: Protime-INR, APTT, Protime-INR, APTT  Chief Complaint  Patient presents with  . Bladder Cancer    Resume primary hepatocellular, no tissue    CURRENT THERAPY: Recent evaluation for TACE deemed the patient  an inappropriate candidate.   INTERVAL HISTORY: Bradley Simmons 79 y.o. male returns for followup of recently diagnosed hepatocellular carcinoma not based on tissue diagnosis but on presentation and markedly elevated alpha-fetoprotein. He presented with a right portal vein thrombus on 04/07/2013.  He still has right upper quadrant abdominal pain with worsening abdominal swelling. He uses one or 2 oxycodone per day to relieve discomfort. He is also notice swelling beginning in both lower extremities as well. He denies any fever, night sweats, sore throat, cough, wheezing. His wife indicates that he has not been eating a lot. He denies any diarrhea but does have light colored stools in [place] with dark-colored urine. He denies any PND, orthopnea, palpitations, headache, fever, or night sweats.  MEDICAL HISTORY: Past Medical History  Diagnosis Date  . Basal cell carcinoma of nose ~ 2010    points to right side of nose" (2012-11-20)  . Hypertension   . Hypercholesteremia   . Heart murmur     "found out recently" (2012/11/20)  . Type II diabetes mellitus   . Arthritis     "joints mostly" (11/20/2012)  . Kidney stone 1980's?    "passed on it's own" (2012-11-20)  . 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 Type II or unspecified type diabetes mellitus  without mention of complication, not stated as uncontrolled 10/28/2012    INTERIM HISTORY: has Anaphylactic shock; Hypotension; HTN (hypertension); Elevated troponin; Type II or unspecified type diabetes mellitus without mention of complication, not stated as uncontrolled; Leukocytosis, unspecified; Abdominal pain; SBP (spontaneous bacterial peritonitis); Cirrhosis of liver without mention of alcohol; Cirrhosis of liver; Varices, esophageal; Thrombocytopenia, unspecified; Liver masses; and Hepatocellular carcinoma on his problem list.    ALLERGIES:  is allergic to bee venom.  MEDICATIONS: has a current medication list which includes the following prescription(s): amlodipine, aspirin, atorvastatin, bismuth subsalicylate, epinephrine, glipizide, lisinopril, metformin, multivitamin with minerals, one touch ultra test, onetouch delica lancets 33g, oxycodone, pantoprazole, ranitidine, sorafenib, furosemide, and spironolactone.  SURGICAL HISTORY:  Past Surgical History  Procedure Laterality Date  . Back surgery    . Lumbar disc surgery  1985    "scraped out calcium deposits" (11-20-2012)  . Cataract extraction w/ intraocular lens implant Right 2013  . Skin cancer excision  ~ 2010    "right side of my nose" (11-20-2012)  . Yag laser application Right 03/09/2013    Procedure: YAG LASER APPLICATION;  Surgeon: Susa Simmonds, MD;  Location: [facility];  Service: Ophthalmology;  Laterality: Right;  . Colonoscopy  2003    Dr. Matthias Hughs: active bleeding from pedunculated polyp, s/p polypectomy and bleeding control therapy with epi  . Colonoscopy  2003    Dr. Matthias Hughs: hyperplastic polyps   . Colonoscopy  March 2014    Dr. Matthias Hughs: tubular adenomas (only path report available in epic, op report not available at time of consult on 12/1.  FAMILY HISTORY: family history includes Cancer in his mother. There is no history of Colon cancer or Liver cancer.  SOCIAL HISTORY:  reports that he quit smoking about 20  years ago. He has never used smokeless tobacco. He reports that he drinks about 3.6 ounces of alcohol per week. He reports that he does not use illicit drugs.  REVIEW OF SYSTEMS:  Other than that discussed above is noncontributory.  PHYSICAL EXAMINATION: ECOG PERFORMANCE STATUS: 2 - Symptomatic, <50% confined to bed  Blood pressure 181/83, pulse 106, temperature 97.7 F (36.5 C), temperature source Oral, resp. rate 16, height 6' (1.829 m), weight 213 lb 3.2 oz (96.707 kg).  GENERAL:alert, no distress and comfortable SKIN: skin color, texture, turgor are normal, no rashes or significant lesions EYES: PERLA; Conjunctiva are pink and non-injected, sclera icteric. OROPHARYNX:no exudate, no erythema on lips, buccal mucosa, or tongue. NECK: supple, thyroid normal size, non-tender, without nodularity. No masses CHEST: Bilateral gynecomastia with no spider angiomata. LYMPH:  no palpable lymphadenopathy in the cervical, axillary or inguinal LUNGS: clear to auscultation and percussion with normal breathing effort HEART: regular rate & rhythm and no murmurs. ABDOMEN: Distended and talked with a positive fluid wave and shifting dullness. Organs or not ballotable. MUSCULOSKELETAL:no cyanosis of digits and no clubbing. Range of motion normal. +1 edema bilateral lower extremities. NEURO: alert & oriented x 3 with fluent speech, no focal motor/sensory deficits   LABORATORY DATA: Office Visit on 04/21/2013  Component Date Value Range Status  . WBC 04/21/2013 7.4  4.0 - 10.5 K/uL Final  . RBC 04/21/2013 4.94  4.22 - 5.81 MIL/uL Final  . Hemoglobin 04/21/2013 16.3  13.0 - 17.0 g/dL Final  . HCT 09/60/4540 46.8  39.0 - 52.0 % Final  . MCV 04/21/2013 94.7  78.0 - 100.0 fL Final  . MCH 04/21/2013 33.0  26.0 - 34.0 pg Final  . MCHC 04/21/2013 34.8  30.0 - 36.0 g/dL Final  . RDW 98/03/9146 16.1* 11.5 - 15.5 % Final  . Platelets 04/21/2013 160  150 - 400 K/uL Final  . Neutrophils Relative % 04/21/2013 76   43 - 77 % Final  . Neutro Abs 04/21/2013 5.6  1.7 - 7.7 K/uL Final  . Lymphocytes Relative 04/21/2013 12  12 - 46 % Final  . Lymphs Abs 04/21/2013 0.9  0.7 - 4.0 K/uL Final  . Monocytes Relative 04/21/2013 12  3 - 12 % Final  . Monocytes Absolute 04/21/2013 0.9  0.1 - 1.0 K/uL Final  . Eosinophils Relative 04/21/2013 1  0 - 5 % Final  . Eosinophils Absolute 04/21/2013 0.1  0.0 - 0.7 K/uL Final  . Basophils Relative 04/21/2013 0  0 - 1 % Final  . Basophils Absolute 04/21/2013 0.0  0.0 - 0.1 K/uL Final  . Retic Ct Pct 04/21/2013 4.4* 0.4 - 3.1 % Final  . RBC. 04/21/2013 4.94  4.22 - 5.81 MIL/uL Final  . Retic Count, Manual 04/21/2013 217.4* 19.0 - 186.0 K/uL Final  . Sodium 04/21/2013 128* 135 - 145 mEq/L Final  . Potassium 04/21/2013 4.5  3.5 - 5.1 mEq/L Final  . Chloride 04/21/2013 92* 96 - 112 mEq/L Final  . CO2 04/21/2013 24  19 - 32 mEq/L Final  . Glucose, Bld 04/21/2013 127* 70 - 99 mg/dL Final  . BUN 82/95/6213 14  6 - 23 mg/dL Final  . Creatinine, Ser 04/21/2013 1.00  0.50 - 1.35 mg/dL Final  . Calcium 08/65/7846 9.7  8.4 - 10.5 mg/dL Final  . Total  Protein 04/21/2013 7.4  6.0 - 8.3 g/dL Final  . Albumin 16/02/9603 2.5* 3.5 - 5.2 g/dL Final  . AST 54/01/8118 444* 0 - 37 U/L Final  . ALT 04/21/2013 140* 0 - 53 U/L Final  . Alkaline Phosphatase 04/21/2013 599* 39 - 117 U/L Final  . Total Bilirubin 04/21/2013 7.8* 0.3 - 1.2 mg/dL Final  . GFR calc non Af Amer 04/21/2013 68* >90 mL/min Final  . GFR calc Af Amer 04/21/2013 79* >90 mL/min Final   Comment: (NOTE)                          The eGFR has been calculated using the CKD EPI equation.                          This calculation has not been validated in all clinical situations.                          eGFR's persistently <90 mL/min signify possible Chronic Kidney                          Disease.  . Ammonia 04/21/2013 46  11 - 60 umol/L Final  . Prothrombin Time 04/21/2013 14.9  11.6 - 15.2 seconds Final  . INR 04/21/2013  1.20  0.00 - 1.49 Final  . aPTT 04/21/2013 37  24 - 37 seconds Final   Comment:                                 IF BASELINE aPTT IS ELEVATED,                          SUGGEST PATIENT RISK ASSESSMENT                          BE USED TO DETERMINE APPROPRIATE                          ANTICOAGULANT THERAPY.  Admission on 04/05/2013, Discharged on 04/07/2013  Component Date Value Range Status  . Glucose-Capillary 04/05/2013 100* 70 - 99 mg/dL Final  . WBC 14/78/2956 5.4  4.0 - 10.5 K/uL Final  . RBC 04/05/2013 4.60  4.22 - 5.81 MIL/uL Final  . Hemoglobin 04/05/2013 15.0  13.0 - 17.0 g/dL Final  . HCT 21/30/8657 44.5  39.0 - 52.0 % Final  . MCV 04/05/2013 96.7  78.0 - 100.0 fL Final  . MCH 04/05/2013 32.6  26.0 - 34.0 pg Final  . MCHC 04/05/2013 33.7  30.0 - 36.0 g/dL Final  . RDW 84/69/6295 13.6  11.5 - 15.5 % Final  . Platelets 04/05/2013 133* 150 - 400 K/uL Final  . Neutrophils Relative % 04/05/2013 69  43 - 77 % Final  . Neutro Abs 04/05/2013 3.7  1.7 - 7.7 K/uL Final  . Lymphocytes Relative 04/05/2013 15  12 - 46 % Final  . Lymphs Abs 04/05/2013 0.8  0.7 - 4.0 K/uL Final  . Monocytes Relative 04/05/2013 14* 3 - 12 % Final  . Monocytes Absolute 04/05/2013 0.7  0.1 - 1.0 K/uL Final  . Eosinophils Relative 04/05/2013 2  0 - 5 % Final  . Eosinophils Absolute 04/05/2013 0.1  0.0 - 0.7 K/uL Final  . Basophils Relative 04/05/2013 0  0 - 1 % Final  . Basophils Absolute 04/05/2013 0.0  0.0 - 0.1 K/uL Final  . Sodium 04/05/2013 134* 135 - 145 mEq/L Final  . Potassium 04/05/2013 4.1  3.5 - 5.1 mEq/L Final  . Chloride 04/05/2013 99  96 - 112 mEq/L Final  . CO2 04/05/2013 26  19 - 32 mEq/L Final  . Glucose, Bld 04/05/2013 101* 70 - 99 mg/dL Final  . BUN 16/02/9603 9  6 - 23 mg/dL Final  . Creatinine, Ser 04/05/2013 0.80  0.50 - 1.35 mg/dL Final  . Calcium 54/01/8118 10.1  8.4 - 10.5 mg/dL Final  . Total Protein 04/05/2013 7.5  6.0 - 8.3 g/dL Final  . Albumin 14/78/2956 2.8* 3.5 - 5.2 g/dL  Final  . AST 21/30/8657 262* 0 - 37 U/L Final  . ALT 04/05/2013 119* 0 - 53 U/L Final  . Alkaline Phosphatase 04/05/2013 440* 39 - 117 U/L Final  . Total Bilirubin 04/05/2013 4.6* 0.3 - 1.2 mg/dL Final  . GFR calc non Af Amer 04/05/2013 82* >90 mL/min Final  . GFR calc Af Amer 04/05/2013 >90  >90 mL/min Final   Comment: (NOTE)                          The eGFR has been calculated using the CKD EPI equation.                          This calculation has not been validated in all clinical situations.                          eGFR's persistently <90 mL/min signify possible Chronic Kidney                          Disease.  . Lipase 04/05/2013 46  11 - 59 U/L Final  . Lactic Acid, Venous 04/05/2013 2.4* 0.5 - 2.2 mmol/L Final  . Troponin I 04/05/2013 <0.30  <0.30 ng/mL Final   Comment:                                 Due to the release kinetics of cTnI,                          a negative result within the first hours                          of the onset of symptoms does not rule out                          myocardial infarction with certainty.                          If myocardial infarction is still suspected,                          repeat the test at appropriate intervals.  . Color, Urine 04/05/2013 YELLOW  YELLOW Final  . APPearance 04/05/2013 CLEAR  CLEAR Final  . Specific Gravity, Urine 04/05/2013 <1.005* 1.005 - 1.030 Final  .  pH 04/05/2013 6.0  5.0 - 8.0 Final  . Glucose, UA 04/05/2013 NEGATIVE  NEGATIVE mg/dL Final  . Hgb urine dipstick 04/05/2013 NEGATIVE  NEGATIVE Final  . Bilirubin Urine 04/05/2013 SMALL* NEGATIVE Final  . Ketones, ur 04/05/2013 NEGATIVE  NEGATIVE mg/dL Final  . Protein, ur 40/98/1191 NEGATIVE  NEGATIVE mg/dL Final  . Urobilinogen, UA 04/05/2013 2.0* 0.0 - 1.0 mg/dL Final  . Nitrite 47/82/9562 NEGATIVE  NEGATIVE Final  . Leukocytes, UA 04/05/2013 NEGATIVE  NEGATIVE Final  . WBC, UA 04/05/2013 0-2  <3 WBC/hpf Final  . RBC / HPF 04/05/2013 0-2  <3 RBC/hpf  Final  . Bacteria, UA 04/05/2013 RARE  RARE Final  . WBC 04/06/2013 5.0  4.0 - 10.5 K/uL Final  . RBC 04/06/2013 4.33  4.22 - 5.81 MIL/uL Final  . Hemoglobin 04/06/2013 14.1  13.0 - 17.0 g/dL Final  . HCT 13/12/6576 42.2  39.0 - 52.0 % Final  . MCV 04/06/2013 97.5  78.0 - 100.0 fL Final  . MCH 04/06/2013 32.6  26.0 - 34.0 pg Final  . MCHC 04/06/2013 33.4  30.0 - 36.0 g/dL Final  . RDW 46/96/2952 13.9  11.5 - 15.5 % Final  . Platelets 04/06/2013 132* 150 - 400 K/uL Final  . Sodium 04/06/2013 137  135 - 145 mEq/L Final  . Potassium 04/06/2013 3.9  3.5 - 5.1 mEq/L Final  . Chloride 04/06/2013 101  96 - 112 mEq/L Final  . CO2 04/06/2013 25  19 - 32 mEq/L Final  . Glucose, Bld 04/06/2013 66* 70 - 99 mg/dL Final  . BUN 84/13/2440 9  6 - 23 mg/dL Final  . Creatinine, Ser 04/06/2013 0.84  0.50 - 1.35 mg/dL Final  . Calcium 03/03/2535 9.6  8.4 - 10.5 mg/dL Final  . Total Protein 04/06/2013 6.8  6.0 - 8.3 g/dL Final  . Albumin 64/40/3474 2.6* 3.5 - 5.2 g/dL Final  . AST 25/95/6387 271* 0 - 37 U/L Final  . ALT 04/06/2013 104* 0 - 53 U/L Final  . Alkaline Phosphatase 04/06/2013 397* 39 - 117 U/L Final  . Total Bilirubin 04/06/2013 4.0* 0.3 - 1.2 mg/dL Final  . GFR calc non Af Amer 04/06/2013 80* >90 mL/min Final  . GFR calc Af Amer 04/06/2013 >90  >90 mL/min Final   Comment: (NOTE)                          The eGFR has been calculated using the CKD EPI equation.                          This calculation has not been validated in all clinical situations.                          eGFR's persistently <90 mL/min signify possible Chronic Kidney                          Disease.  . AFP-Tumor Marker 04/06/2013 >200000.0* 0.0 - 8.0 ng/mL Final   Comment: (NOTE)                          Result repeated and verified.                          Result confirmed by automatic dilution.  The [employer] Centaur AFP immunoassay method is used.  Results obtained                          with  different assay methods or kits cannot be used interchangeably.                          AFP is a valuable aid in the management of nonseminomatous testicular                          cancer patients when used in conjunction with information available                          from the clinical evaluation and other diagnostic procedures.                          Increased serum AFP concentrations have also been observed in ataxia                          telangiectasia, hereditary tyrosinemia, primary hepatocellular                          carcinoma, teratocarcinoma, gastrointestinal tract cancers with and                          without liver metastases, and in benign hepatic conditions such as                          acute viral hepatitis, chronic active hepatitis, and cirrhosis.  This                          result cannot be interpreted as absolute evidence of the presence or                          absence of malignant disease.  This result is not interpretable in                          pregnant females.                          Performed at [facility]  . Hemoglobin A1C 04/06/2013 6.9* <5.7 % Final   Comment: (NOTE)                                                                                                                         According to the ADA Clinical Practice Recommendations for 2011, when  HbA1c is used as a screening test:                           >=6.5%   Diagnostic of Diabetes Mellitus                                    (if abnormal result is confirmed)                          5.7-6.4%   Increased risk of developing Diabetes Mellitus                          References:Diagnosis and Classification of Diabetes Mellitus,Diabetes                          Care,2011,34(Suppl 1):S62-S69 and Standards of Medical Care in                                  Diabetes - 2011,Diabetes Care,2011,34 (Suppl 1):S11-S61.  . Mean Plasma Glucose  04/06/2013 151* <117 mg/dL Final   Performed at [facility]  . Glucose-Capillary 04/05/2013 120* 70 - 99 mg/dL Final  . Comment 1 96/08/5407 Notify RN   Final  . Glucose-Capillary 04/06/2013 55* 70 - 99 mg/dL Final  . Comment 1 81/19/1478 Documented in Chart   Final  . Comment 2 04/06/2013 Notify RN   Final  . Glucose-Capillary 04/06/2013 116* 70 - 99 mg/dL Final  . Comment 1 29/56/2130 Documented in Chart   Final  . Comment 2 04/06/2013 Notify RN   Final  . Hepatitis B Surface Ag 04/06/2013 NEGATIVE  NEGATIVE Final  . HCV Ab 04/06/2013 NEGATIVE  NEGATIVE Final  . Hep A IgM 04/06/2013 NON REACTIVE  NON REACTIVE Final  . Hep B C IgM 04/06/2013 NON REACTIVE  NON REACTIVE Final   Comment: (NOTE)                          High levels of Hepatitis B Core IgM antibody are detectable                          during the acute stage of Hepatitis B. This antibody is used                          to differentiate current from past HBV infection.                          Performed at [facility]  . Hep B S Ab 04/06/2013 NEGATIVE  NEGATIVE Final   Performed at [facility]  . Hep A Total Ab 04/06/2013 Reactive* NON REACTIVE Final   Performed at [facility]  . Prothrombin Time 04/06/2013 14.8  11.6 - 15.2 seconds Final  . INR 04/06/2013 1.19  0.00 - 1.49 Final  . Glucose-Capillary 04/06/2013 181* 70 - 99 mg/dL Final  . Comment 1 86/57/8469 Documented in Chart   Final  . Comment 2 04/06/2013 Notify RN   Final  . Iron 04/06/2013 34* 42 - 135 ug/dL Final  .  TIBC 04/06/2013 241  215 - 435 ug/dL Final  . Saturation Ratios 04/06/2013 14* 20 - 55 % Final  . UIBC 04/06/2013 207  125 - 400 ug/dL Final   Performed at [facility]  . Ferritin 04/06/2013 934* 22 - 322 ng/mL Final   Performed at [facility]  . Transferrin 04/06/2013 183* 200 - 360 mg/dL Final   Performed at [facility]  . Glucose-Capillary 04/06/2013 181* 70 - 99 mg/dL Final  .  Comment 1 16/02/9603 Documented in Chart   Final  . Comment 2 04/06/2013 Notify RN   Final  . Sodium 04/07/2013 138  135 - 145 mEq/L Final  . Potassium 04/07/2013 4.2  3.5 - 5.1 mEq/L Final  . Chloride 04/07/2013 101  96 - 112 mEq/L Final  . CO2 04/07/2013 29  19 - 32 mEq/L Final  . Glucose, Bld 04/07/2013 62* 70 - 99 mg/dL Final  . BUN 54/01/8118 10  6 - 23 mg/dL Final  . Creatinine, Ser 04/07/2013 0.89  0.50 - 1.35 mg/dL Final  . Calcium 14/78/2956 9.6  8.4 - 10.5 mg/dL Final  . GFR calc non Af Amer 04/07/2013 78* >90 mL/min Final  . GFR calc Af Amer 04/07/2013 >90  >90 mL/min Final   Comment: (NOTE)                          The eGFR has been calculated using the CKD EPI equation.                          This calculation has not been validated in all clinical situations.                          eGFR's persistently <90 mL/min signify possible Chronic Kidney                          Disease.  . Glucose-Capillary 04/06/2013 141* 70 - 99 mg/dL Final  . Comment 1 21/30/8657 Notify RN   Final  . Glucose-Capillary 04/07/2013 66* 70 - 99 mg/dL Final  . Comment 1 84/69/6295 Notify RN   Final  . Glucose-Capillary 04/07/2013 124* 70 - 99 mg/dL Final  . Comment 1 28/41/3244 Notify RN   Final  . Glucose-Capillary 04/07/2013 235* 70 - 99 mg/dL Final    PATHOLOGY: No tissue diagnosis has been established.  Urinalysis    Component Value Date/Time   COLORURINE YELLOW 04/05/2013 1435   APPEARANCEUR CLEAR 04/05/2013 1435   LABSPEC <1.005* 04/05/2013 1435   PHURINE 6.0 04/05/2013 1435   GLUCOSEU NEGATIVE 04/05/2013 1435   HGBUR NEGATIVE 04/05/2013 1435   BILIRUBINUR SMALL* 04/05/2013 1435   BILIRUBINUR neg 11/06/2012 0833   KETONESUR NEGATIVE 04/05/2013 1435   PROTEINUR NEGATIVE 04/05/2013 1435   UROBILINOGEN 2.0* 04/05/2013 1435   UROBILINOGEN negative 11/06/2012 0833   NITRITE NEGATIVE 04/05/2013 1435   NITRITE neg 11/06/2012 0833   LEUKOCYTESUR NEGATIVE 04/05/2013 1435    RADIOGRAPHIC  STUDIES: Dg Chest 2 View  04/05/2013   CLINICAL DATA:  Abdominal pain  EXAM: CHEST  2 VIEW  COMPARISON:  Chest radiograph 10/27/2012 and 12/05/2007  FINDINGS: Mild cardiomegaly. Normal pulmonary vascularity mediastinal contours. Atherosclerotic calcification of the thoracic aortic arch. No airspace disease. No visible pleural effusion or pneumothorax. No acute osseous abnormality.  IMPRESSION: Mild cardiomegaly.  No acute findings.  Electronically Signed   By: Britta Mccreedy M.D.   On: 04/05/2013 15:55   Mr Abdomen W Wo Contrast  04/06/2013   CLINICAL DATA:  Abdominal pain. Hepatic cirrhosis. Liver mass seen on CT.  EXAM: MRI ABDOMEN WITHOUT AND WITH CONTRAST  TECHNIQUE: Multiplanar multisequence MR imaging of the abdomen was performed both before and after the administration of intravenous contrast.  CONTRAST:  10 cc Eovist  COMPARISON:  CT on 04/05/2013  FINDINGS: Hepatic cirrhosis is demonstrated. Thrombosis of the right portal vein is demonstrated. A dominant mass in the right hepatic lobe measures 7 x 10 cm on image 36 of series 8. The right hepatic lobe shows diffusely heterogeneous signal with numerous other small nodules particularly in the posterior segment of the right hepatic lobe, which show T2 hyperintensity and lack of Eovist retention on delayed imaging, suspicious for multifocal hepatoma. No definite masses are seen involving the left hepatic lobe or caudate lobe.  Mild ascites is noted. No evidence of splenomegaly. Varices are seen in the gastrohepatic ligament, consistent with portal venous hypertension. The gallbladder is unremarkable. Shotty less than 1 cm lymph nodes are seen porta hepatis, none of which are pathologically enlarged. No pathologically enlarged nodes are visualized within the abdomen.  The pancreas, adrenal glands, and kidneys are normal in appearance. No evidence of hydronephrosis. Benign hemangioma incidentally noted in the lower thoracic spine.  IMPRESSION: Hepatic  cirrhosis, with dominant 10 cm mass in the right hepatic lobe. Numerous other small nodules in the posterior segment of the right hepatic lobe are suspicious for multifocal hepatoma. There is no definite tumor involvement of the left hepatic lobe.  Right portal vein thrombosis, likely due tumor thrombus. No other sites of abdominal metastatic disease identified.  Mild ascites, and upper abdominal varices consistent with portal venous hypertension.   Electronically Signed   By: Myles Rosenthal M.D.   On: 04/06/2013 14:55   Ct Abdomen Pelvis W Contrast  04/05/2013   CLINICAL DATA:  Skin cancer.  Abdominal pain.  EXAM: CT ABDOMEN AND PELVIS WITH CONTRAST  TECHNIQUE: Multidetector CT imaging of the abdomen and pelvis was performed using the standard protocol following bolus administration of intravenous contrast.  CONTRAST:  50mL OMNIPAQUE IOHEXOL 300 MG/ML SOLN, OMNIPAQUE IOHEXOL 300 MG/ML SOLN  COMPARISON:  None.  FINDINGS: Diminished intravenous contrast limits the exam.  The liver is cirrhotic characterize by a nodular contour and enlargement of the left lobe. There is ill-defined infiltrating low density within the anterior segment of the right lobe of the liver measuring 15.6 x 7.4 x 10.6 cm. Underlying malignancy is suggested.  There is a filling defect occluding the right portal vein extending into the distal main portal vein. It has elevated lb Foley in a MEASUREMENTS worrisome for tumor occlusion rather than thrombus. There is poor filling of the superior mesenteric vein loculated to phase of contrast. Splenic vein is patent.  Pelvic cysts in both kidneys. Simple cysts in both kidneys within the cortex. No definite solid renal mass.  Small amount of free fluid about the liver and spleen. The spleen is normal in size.  Pancreas, adrenal glands, gallbladder are within normal limits.  Umbilical vein is recannulized, a finding associated with portal hypertension.  Several sub cm nodules are seen at the  posterior lung bases. Some are calcified. There are patchy densities at the posterior lower right lung base related to pneumonitis or volume loss.  Several venous structures at the gastroesophageal junction are worrisome for a gastroesophageal varices.  There is a small amount of free fluid in the pelvis.  Left inguinal hernia thinning adipose tissue.  Advanced degenerative changes in the lumbar spine. T10 wedge compression deformity of indeterminate age is present involving its superior endplate. There is 20% loss of height anteriorly no definite destructive bone lesion.  IMPRESSION: Cirrhosis.  Ascites.  Gastroesophageal varices.  Fidnikgns worrisome for hepatocellular carcinoma with tumor extension into the right portal vein resulting in occlusion.  T10 compression fracture of indeterminate age.  Bibasilar sub cm pulmonary nodules.   Electronically Signed   By: Maryclare Bean M.D.   On: 04/05/2013 15:34   US Abdomen Limited  04/07/2013   CLINICAL DATA:  Cirrhotic liver with hepatic mass question hepatoma, ascites, assess for paracentesis  EXAM: LIMITED ABDOMEN ULTRASOUND FOR ASCITES  TECHNIQUE: Limited ultrasound survey for ascites was performed in all four abdominal quadrants.  COMPARISON:  04/06/2013  FINDINGS: Small amount of ascites is seen at the right pericolic gutter/right lower quadrant.  Numerous bowel loops are present within this small collection of ascites. Amount of ascites is insufficient for paracentesis with no adequate pocket identified to safely allow for the performance of the procedure.  IMPRESSION: Small amount of ascites in the right lower quadrant, insufficient for paracentesis.   Electronically Signed   By: Ulyses Southward M.D.   On: 04/07/2013 11:20   US Abdomen Limited  04/06/2013   CLINICAL DATA:  Ascites, assessment for paracentesis  EXAM: LIMITED ABDOMEN ULTRASOUND FOR ASCITES  TECHNIQUE: Limited ultrasound survey for ascites was performed in all four abdominal quadrants.  COMPARISON:  CT  abdomen and pelvis 04/05/2013  FINDINGS: Small amounts of ascites are identified perihepatic, perisplenic, and in the pelvis.  Multiple bowel loops are seen at the pelvic collections in the lower quadrants bilaterally, limiting access.  No sufficient pocket of accessible fluid is identified.  IMPRESSION: Small amount of ascites.  No accessible pockets of significant fluid are identified.   Electronically Signed   By: Ulyses Southward M.D.   On: 04/06/2013 11:39   [facility]  04/09/2013   PHYSICAL EXAMINATION: Vital Signs: Height, 6'0"; weight - 200 lbs; blood pressure - 166/78; heart rate - 98; respiratory rate - 18; 97$ on room air; temperature - 98.4.  General appearance: This is a well appearing Caucasian male who appears younger than his stated age. No yellowing of the skin or eyes.  Cardiovascular:  Holosystolic murmur.  Regular rate.  Respiratory:  Clear to auscultation bilaterally.  Abdomen: The abdomen is distended, especially about the periumbilical region and as such, I suspect a minimal amount of intraabdominal ascites. No definitive fluid wave. Positive bowel signs. Abdomen is non-tender to palpation, in particular, there is no discomfort with palpation of the right upper abdominal quadrant. No definitive caput medusae  Neurologic: Patient is able to ambulate around the clinic and get on and off the examination table without difficulty or assistance. No asterixis.  Imaging: CT abdomen and pelvis - 04/05/2013; abdominal MRI - 04/06/2013.  Dominant mass within the right lobe of the liver measures at least 14 cm in diameter with numerous additional satellite lesions scattered throughout the right lobe. There is invasion of tumor thrombus into the right portal vein as well as the distal aspect of the main portal vein.  Stigmata of cirrhosis with nodular contour of hepatic parenchyma. Minimal amount of intra-abdominal ascites with fluid seen adjacent to the liver edge in about the spleen. No  definitive evidence of splenomegaly. Several prominent gastroesophageal  varices are also noted.  Relevant Laboratory Values: (All laboratory values are from 04/05/2013 unless otherwise specified).  AST - 26  ALT - 119  Alkaline phosphatase - 440  Total bilirubin - 4.6  Creatinine - 0.8  Hemoglobin - 13.6  Hematocrit - 38.7  White blood cell count - 5.4  Platelets - 133  INR - 1.19 (04/06/2013)  Alpha fetoprotein - greater than 200,000 (04/06/2013)  Hepatitis A antibody: reactive (04/06/2013)  Hepatitis B antibody: negative (04/06/2013)  ECOG status:  0  Assessment and Plan:  This is a very pleasant 79 year old male with past medical history significant for alcohol abuse with recent diagnosis of cirrhosis, portal venous hypertension and a large (>14 cm) mass nearly replacing the entirety of the right lobe of the liver with extension of tumor into the right portal vein as well as the distal aspect of the right main portal vein, recently discovered during hospitalization at [facility] in the workup for patient's abdominal pain. While the patient has not undergone a liver biopsy, his AFP is markedly elevated (> 200,000) which when combined with his history of alcohol abuse and imaging stigmata of cirrhosis, findings are compatible with infiltrative hepatocellular carcinoma. Despite the patient's extensive disease on imaging, the patient remains remarkably asymptomatic with ECOG status of 0.  Unfortunately, given the large size of the patient's presumed hepatocellular carcinoma, he is not an ablation candidate. As there is invasion and occlusion of the right portal vein, the patient is also not a candidate for bland hepatic arterial embolization. Additionally, given his background of cirrhosis, the patient's synthetic liver function has been affected resulting in a diffusely elevated transaminitis, specifically, an elevated bilirubin of greater than 4 and as such, this patient is not a candidate for transcatheter  liver directed therapy, including either chemoembolization or Y-90 radioembolization, as any potential treatment would likely precipitate liver failure. These contraindications to treatment were explained in detail to the patient and the patient's wife.  Unfortunately, I feel the patient's treatment options are limited though would defer to Dr. Zigmund Daniel for the appropriateness of any potential systemic therapy.  Thank you for the referral and involvement in this patient's care.  : Examination:  NEW PATIENT OFFICE VISIT - LEVEL IV ([id number])  CHIEF COMPLAINT:  Recent diagnosis of hepatocellular carcinoma.  Referring physicians: Primary care physician - Dr. Irene Limbo; Oncology - Dellis Anes, [place] and Dr. Zigmund Daniel; Gastroenterology - Gerrit Halls, NP;  HISTORY OF PRESENT ILLNESS: This is a very pleasant 79 year old well appearing Caucasian male with past medical history significant for alcohol abuse, who was recently admitted to [facility] on 04/05/2013 for worsening abdominal pain during the preceding 2-3 weeks. Work up for his abdominal pain included an abdominal CT which demonstrated a large (>14 cm) infiltrative mass nearly replacing the right lobe of the liver with extension to occlude the right portal vein and distal aspect of the main portal vein. Additional findings of cirrhosis and gastroesophageal varices were also demonstrated on this examination, all of which are new diagnoses. Subsequent alpha fetoprotein level was obtained which was noted to be markedly elevated (>200,000). The patient's liver function tests were noted to be diffusely elevated, including a bilirubin of greater than 4.  While an inpatient at [facility], the patient was evaluated by both the gastroenterology Gerrit Halls, NP) and oncology Dellis Anes, [place] and Dr. Zigmund Daniel) services and has been referred to [facility] for evaluation for candidacy for potential percutaneous treatment options. The patient is  accompanied by his wife.  Since discharge from the hospital, the patient denies any abdominal pain. He does state that his abdomen has remain distended though this has not worsened in the interval. Of note, the patient had two attempted ultrasound guided paracenteses performed while an in-patient at [facility] (04/06/2013 and 04/07/2013), however, not enough fluid was identified for adequate percutaneous drainage. The patient is currently without complaint. The patient denies lower extremity swelling. Denies yellowing of the skin or eyes. Denies nausea or vomiting. No bloody or melanotic stools. Denies altered mental status or issues with confusion, which was confirmed by the patient's wife.  Past Medical History:  1. Basal cell carcinoma on the nose (2010). 2. Hypertension. 3. Hypercholesterolemia. 4. Heart murmur. 5. Type 2 diabetes. Past Surgical History:  1. Back surgery - 1985. 2. Cataract excision with intraocular lens implant, right - 2013. 3. Skin cancer excision - 2010. Social History: Patient reports fairly significant alcohol use extending for many years until approximately 2009 at which time, the patient reports drinking "several gallons" of bourbon per month. Since 2009, the patient has reduced his drinking though admits to an occasional beer. Patient is a former smoker. Denies recreational or illicit drug use.  Allergies: Bee venom (anaphylaxis). Denies contrast or Latex allergy.  Medications:  1. Amlodipine 5 mg p.o. q day 2. Aspirin 81 mg p.o. q day 3. Folic acid 1 mg p.o. q day 4. Lisinopril 10 mg p.o. q day 5. Multivitamin 1 mg p.o. q day 6. Oxycodone immediate release tablet 5 mg p.o. q 4 hrs prn pain 7. Protonix EC tablet 40 mg p.o. q day 8. Thiamin tablet 100 mg p.o. q day Family History:  Noncontributory.  Review of Systems:  General: Patient is currently without complaints. Denies unintentional weight gain, loss or change in energy level.  Cardiovascular:  No chest pain.  Respiratory:   No cough or hemoptysis.  GI: There has been interval resolution of his abdominal pain since discharge from the hospital. Patient currently states he is not taking his prescribed pain medication. No nausea or vomiting. No constipation, diarrhea, bloody or melanotic stools.  Neurologic:  No headache or confusion.  Extremities:  No lower extremity edema.   Electronically Signed   By: Simonne Come M.D.   On: 04/09/2013 14:09    ASSESSMENT:  #1. Clinical presentation consistent with hepatocellular carcinoma, not a candidate for TACE. #2. Cirrhosis of the liver, Childs-Pugh C. #3. Diabetes mellitus, type II, non-insulin requiring, controlled. #4. Hypertension, controlled. #5. History of kidney stone in June of 2014, quiescent.   PLAN:  #1. In the presence of his wife and a friend, discussion was held regarding hepatocellular carcinoma and its management. #2. Diuretic therapy has been ordered with Aldactone 50 mg twice a day in conjunction with furosemide 40 mg daily in an effort to mobilize  ascitic fluid. #3. Trial of sorafenib at 200 mg daily at bedtime to begin and gradually increase dose based on tolerability and response. #4. Continue oxycodone as needed for pain. #5. Followup in 3 weeks with CBC and chem profile. #6. Overall prognosis is guarded.   All questions were answered. The patient knows to call the clinic with any problems, questions or concerns. We can certainly see the patient much sooner if necessary.   I spent 25 minutes counseling the patient face to face. The total time spent in the appointment was 30 minutes.    Maurilio Lovely, MD 04/21/2013 10:04 AM

## 2013-04-21 NOTE — Patient Instructions (Signed)
[facility] Discharge Instructions  RECOMMENDATIONS MADE BY THE CONSULTANT AND ANY TEST RESULTS WILL BE SENT TO YOUR REFERRING PHYSICIAN.  Lab work today. A prescription for Sorafenib has been sent to your pharmacy. This is an oral chemotherapy agent. A prescription for a [name] pain medication was given to you today. There are 2 additional [name] prescriptions at your pharmacy, spironolactone and furosemide. Take all of the above 4 Bradley Simmons medications as prescribed. Let us know if you have any difficulty obtaining them or if you are not tolerating them. Return to clinic as scheduled. Report any issues/concerns to clinic as needed prior to appointment.  Sorafenib Oral Tablet What is this medicine? SORAFENIB (soe RAF e nib) is a chemotherapy drug. It targets specific proteins within cancer cells and stops the cancer cell from growing. This medicine is used to treat liver cancer and kidney cancer. This medicine may be used for other purposes; ask your health care provider or pharmacist if you have questions. COMMON BRAND NAME(S): Nexavar What should I tell my health care provider before I take this medicine? They need to know if you have any of these conditions: -bleeding problems -heart disease -high blood pressure -kidney disease -liver disease -lung cancer -recent surgery -an unusual or allergic reaction to sorafenib, other medicines, foods, dyes, or preservatives -pregnant or trying to get pregnant -breast-feeding How should I use this medicine? Take this medicine by mouth with a glass of water. Follow the directions on the prescription label. Do not cut, crush or chew this medicine. Take this medicine on an empty stomach, at least 1 hour before or 2 hours after meals. Do not take with food. Take your medicine at regular intervals. Do not take it more often than directed. Do not stop taking except on your doctor's advice. Talk to your pediatrician regarding the use of  this medicine in children. Special care may be needed. Overdosage: If you think you have taken too much of this medicine contact a poison control center or emergency room at once. NOTE: This medicine is only for you. Do not share this medicine with others. What if I miss a dose? If you miss a dose, take it as soon as you can. If it is almost time for your next dose, take only that dose. Do not take double or extra doses. What may interact with this medicine? This medicine may interact with the following medications: -carbamazepine -dexamethasone -medicines for seizures like carbamazepine, phenobarbital, phenytoin -neomycin -rifabutin -rifampin -St. John's Wort -warfarin This list may not describe all possible interactions. Give your health care provider a list of all the medicines, herbs, non-prescription drugs, or dietary supplements you use. Also tell them if you smoke, drink alcohol, or use illegal drugs. Some items may interact with your medicine. What should I watch for while using this medicine? This drug may make you feel generally unwell. This is not uncommon, as chemotherapy can affect healthy cells as well as cancer cells. Report any side effects. Continue your course of treatment even though you feel ill unless your doctor tells you to stop. Men and women should use effective birth control while taking this medicine and for 2 weeks after stopping this medicine. Do not become pregnant while taking this medicine. Women should inform their doctor if they wish to become pregnant or think they might be pregnant. There is a potential for serious side effects to an unborn child. Talk to your health care professional or pharmacist for more information. Do  not breast-feed an infant while taking this medicine. If you are going to have surgery or any other procedures, tell your doctor you are taking this medicine. What side effects may I notice from receiving this medicine? Side effects that you  should report to your doctor or health care professional as soon as possible: -allergic reactions like skin rash, itching or hives, swelling of the face, lips, or tongue -black, tarry stools -breathing problems -chest pain or chest tightness -dark urine -dizziness -fast or irregular heartbeat -feeling faint or lightheaded -high fever -light-colored stools -nausea, vomiting -redness, blistering, peeling or loosening of the skin, including inside the mouth -right upper belly pain -sores on the hands or feet -spitting up blood or brown material that looks like coffee grounds -stomach pain -yellowing of the eyes or skinSide effects that usually do not require medical attention (report to your doctor or health care professional if they continue or are bothersome): -diarrhea -hair loss -loss of appetite -tiredness -weight loss This list may not describe all possible side effects. Call your doctor for medical advice about side effects. You may report side effects to FDA at 1-800-FDA-1088. Where should I keep my medicine? Keep out of the reach of children. Store at room temperature between 15 and 30 degrees C (59 and 86 degrees F). Protect from moisture. Throw away any unused medicine after the expiration date. NOTE: This sheet is a summary. It may not cover all possible information. If you have questions about this medicine, talk to your doctor, pharmacist, or health care provider.  2014, [employer]vier/Gold Standard. (2011-03-09 14:11:09)   Thank you for choosing [name] to provide your oncology and hematology care.  To afford each patient quality time with our providers, please arrive at least 15 minutes before your scheduled appointment time.  With your help, our goal is to use those 15 minutes to complete the necessary work-up to ensure our physicians have the information they need to help with your evaluation and healthcare recommendations.    Effective January 1st, 2014,  we ask that you re-schedule your appointment with our physicians should you arrive 10 or more minutes late for your appointment.  We strive to give you quality time with our providers, and arriving late affects you and other patients whose appointments are after yours.    Again, thank you for choosing [facility].  Our hope is that these requests will decrease the amount of time that you wait before being seen by our physicians.       _____________________________________________________________  Should you have questions after your visit to [facility], please contact our office at [phone] between the hours of 8:30 a.m. and 5:00 p.m.  Voicemails left after 4:30 p.m. will not be returned until the following business day.  For prescription refill requests, have your pharmacy contact our office with your prescription refill request.

## 2013-04-24 ENCOUNTER — Telehealth (HOSPITAL_COMMUNITY): Payer: Self-pay | Admitting: Hematology and Oncology

## 2013-04-24 NOTE — Telephone Encounter (Signed)
Message copied by Maurilio Lovely on Fri Apr 24, 2013 11:47 AM ------      Message from: [place], Rexene Agent      Created: Fri Apr 24, 2013 10:47 AM       Tyler Aas called for pt. She said the "fluid" medication he was given Monday is not helping with the fluid in his abdomen. She reports he is still uncomfortable with mild relief. She wants to know if she can talk with you to discuss other medications or something that will help.Reports he hasn't received his chemotherapy medication yet either. #[phone] ------

## 2013-05-03 ENCOUNTER — Other Ambulatory Visit (HOSPITAL_COMMUNITY): Payer: Self-pay | Admitting: Internal Medicine

## 2013-05-09 ENCOUNTER — Emergency Department (HOSPITAL_COMMUNITY): Payer: Medicare Other

## 2013-05-09 ENCOUNTER — Emergency Department (HOSPITAL_COMMUNITY)
Admission: EM | Admit: 2013-05-09 | Discharge: 2013-06-07 | Disposition: E | Payer: Medicare Other | Attending: Emergency Medicine | Admitting: Emergency Medicine

## 2013-05-09 DIAGNOSIS — R011 Cardiac murmur, unspecified: Secondary | ICD-10-CM | POA: Insufficient documentation

## 2013-05-09 DIAGNOSIS — I469 Cardiac arrest, cause unspecified: Secondary | ICD-10-CM | POA: Insufficient documentation

## 2013-05-09 DIAGNOSIS — E119 Type 2 diabetes mellitus without complications: Secondary | ICD-10-CM | POA: Insufficient documentation

## 2013-05-09 DIAGNOSIS — Z87442 Personal history of urinary calculi: Secondary | ICD-10-CM | POA: Insufficient documentation

## 2013-05-09 DIAGNOSIS — Z7982 Long term (current) use of aspirin: Secondary | ICD-10-CM | POA: Insufficient documentation

## 2013-05-09 DIAGNOSIS — R4189 Other symptoms and signs involving cognitive functions and awareness: Secondary | ICD-10-CM

## 2013-05-09 DIAGNOSIS — R404 Transient alteration of awareness: Secondary | ICD-10-CM | POA: Insufficient documentation

## 2013-05-09 DIAGNOSIS — Z85828 Personal history of other malignant neoplasm of skin: Secondary | ICD-10-CM | POA: Insufficient documentation

## 2013-05-09 DIAGNOSIS — I10 Essential (primary) hypertension: Secondary | ICD-10-CM | POA: Insufficient documentation

## 2013-05-09 DIAGNOSIS — M129 Arthropathy, unspecified: Secondary | ICD-10-CM | POA: Insufficient documentation

## 2013-05-09 DIAGNOSIS — Z79899 Other long term (current) drug therapy: Secondary | ICD-10-CM | POA: Insufficient documentation

## 2013-05-09 DIAGNOSIS — Z87891 Personal history of nicotine dependence: Secondary | ICD-10-CM | POA: Insufficient documentation

## 2013-05-09 DIAGNOSIS — I959 Hypotension, unspecified: Secondary | ICD-10-CM | POA: Insufficient documentation

## 2013-05-09 DIAGNOSIS — E7800 Pure hypercholesterolemia, unspecified: Secondary | ICD-10-CM | POA: Insufficient documentation

## 2013-05-09 LAB — CBC WITH DIFFERENTIAL/PLATELET
BASOS ABS: 0 10*3/uL (ref 0.0–0.1)
Basophils Relative: 0 % (ref 0–1)
Eosinophils Absolute: 0 10*3/uL (ref 0.0–0.7)
Eosinophils Relative: 0 % (ref 0–5)
HCT: 41.2 % (ref 39.0–52.0)
HEMOGLOBIN: 14.2 g/dL (ref 13.0–17.0)
Lymphocytes Relative: 21 % (ref 12–46)
Lymphs Abs: 1.3 10*3/uL (ref 0.7–4.0)
MCH: 31.1 pg (ref 26.0–34.0)
MCHC: 34.5 g/dL (ref 30.0–36.0)
MCV: 90.4 fL (ref 78.0–100.0)
MONOS PCT: 9 % (ref 3–12)
Monocytes Absolute: 0.5 10*3/uL (ref 0.1–1.0)
NEUTROS ABS: 4.4 10*3/uL (ref 1.7–7.7)
Neutrophils Relative %: 71 % (ref 43–77)
Platelets: 121 10*3/uL — ABNORMAL LOW (ref 150–400)
RBC: 4.56 MIL/uL (ref 4.22–5.81)
RDW: 18.4 % — AB (ref 11.5–15.5)
WBC: 6.2 10*3/uL (ref 4.0–10.5)

## 2013-05-09 LAB — URINALYSIS, ROUTINE W REFLEX MICROSCOPIC
GLUCOSE, UA: NEGATIVE mg/dL
Hgb urine dipstick: NEGATIVE
Ketones, ur: 15 mg/dL — AB
Nitrite: POSITIVE — AB
PH: 5 (ref 5.0–8.0)
Protein, ur: NEGATIVE mg/dL
SPECIFIC GRAVITY, URINE: 1.019 (ref 1.005–1.030)
Urobilinogen, UA: 0.2 mg/dL (ref 0.0–1.0)

## 2013-05-09 LAB — GLUCOSE, CAPILLARY: Glucose-Capillary: 41 mg/dL — CL (ref 70–99)

## 2013-05-09 LAB — PROTIME-INR
INR: 1.93 — ABNORMAL HIGH (ref 0.00–1.49)
Prothrombin Time: 21.5 seconds — ABNORMAL HIGH (ref 11.6–15.2)

## 2013-05-09 LAB — COMPREHENSIVE METABOLIC PANEL
ALBUMIN: 1.3 g/dL — AB (ref 3.5–5.2)
ALK PHOS: 358 U/L — AB (ref 39–117)
ALT: 201 U/L — AB (ref 0–53)
AST: 739 U/L — ABNORMAL HIGH (ref 0–37)
BILIRUBIN TOTAL: 10.3 mg/dL — AB (ref 0.3–1.2)
BUN: 81 mg/dL — ABNORMAL HIGH (ref 6–23)
CHLORIDE: 91 meq/L — AB (ref 96–112)
CO2: 10 mEq/L — CL (ref 19–32)
Calcium: 6.7 mg/dL — ABNORMAL LOW (ref 8.4–10.5)
Creatinine, Ser: 5.14 mg/dL — ABNORMAL HIGH (ref 0.50–1.35)
GFR calc Af Amer: 11 mL/min — ABNORMAL LOW (ref >90)
GFR calc non Af Amer: 9 mL/min — ABNORMAL LOW (ref >90)
Glucose, Bld: 148 mg/dL — ABNORMAL HIGH (ref 70–99)
SODIUM: 126 meq/L — AB (ref 137–147)
Total Protein: 4.3 g/dL — ABNORMAL LOW (ref 6.0–8.3)

## 2013-05-09 LAB — URINE MICROSCOPIC-ADD ON

## 2013-05-09 LAB — LACTIC ACID, PLASMA: Lactic Acid, Venous: 8.8 mmol/L — ABNORMAL HIGH (ref 0.5–2.2)

## 2013-05-09 MED ORDER — DEXTROSE 50 % IV SOLN
1.0000 | Freq: Once | INTRAVENOUS | Status: AC
  Administered 2013-05-10: 50 mL via INTRAVENOUS

## 2013-05-09 MED ORDER — SODIUM CHLORIDE 0.9 % IV BOLUS (SEPSIS)
2000.0000 mL | Freq: Once | INTRAVENOUS | Status: AC
  Administered 2013-05-09: 2000 mL via INTRAVENOUS

## 2013-05-09 MED ORDER — DEXTROSE 50 % IV SOLN
INTRAVENOUS | Status: AC
  Administered 2013-05-09: 23:00:00 50 mL via INTRAVENOUS
  Filled 2013-05-09: qty 50

## 2013-05-09 MED ORDER — SODIUM CHLORIDE 0.9 % IV SOLN: 1000.0000 mL | INTRAVENOUS | Status: DC

## 2013-05-09 MED ORDER — ALBUTEROL SULFATE (2.5 MG/3ML) 0.083% IN NEBU: 10.0000 mg | INHALATION_SOLUTION | Freq: Once | RESPIRATORY_TRACT | Status: DC

## 2013-05-09 MED ORDER — DEXTROSE 50 % IV SOLN
1.0000 | Freq: Once | INTRAVENOUS | Status: AC
  Administered 2013-05-09: 50 mL via INTRAVENOUS
  Filled 2013-05-09: qty 50

## 2013-05-09 MED ORDER — PROPOFOL 10 MG/ML IV EMUL
INTRAVENOUS | Status: DC
Start: 2013-05-09 — End: 2013-05-10
  Filled 2013-05-09: qty 100

## 2013-05-09 MED ORDER — INSULIN ASPART 100 UNIT/ML IV SOLN: 10.0000 [IU] | Freq: Once | INTRAVENOUS | Status: DC

## 2013-05-09 MED ORDER — CALCIUM GLUCONATE 10 % IV SOLN
1.0000 g | Freq: Once | INTRAVENOUS | Status: DC
  Filled 2013-05-09: qty 10

## 2013-05-09 MED ORDER — SODIUM BICARBONATE 8.4 % IV SOLN
50.0000 meq | Freq: Once | INTRAVENOUS | Status: AC
  Administered 2013-05-10: 50 meq via INTRAVENOUS
  Filled 2013-05-09: qty 50

## 2013-05-09 MED ORDER — SODIUM CHLORIDE 0.9 % IV SOLN: 1000.0000 mL | Freq: Once | INTRAVENOUS | Status: DC

## 2013-05-09 NOTE — ED Notes (Signed)
Pt currently being externally paced at rate of 75. Pt remains unresponsive. Pulses palpated in all four extremities.

## 2013-05-09 NOTE — ED Notes (Signed)
Pt continues to be paced at rate of 88.

## 2013-05-09 NOTE — ED Provider Notes (Signed)
CSN: [id number]     Arrival date & time 05/16/2013  2126 History   First MD Initiated Contact with Patient 05/22/2013 2142     No chief complaint on file.  (Consider location/radiation/quality/duration/timing/severity/associated sxs/prior Treatment) HPI Comments: 80 yo M with hx of recently diagnosed with liver failure/cancer. Per the wife, patient has been decreasingly responsive during the day, refusing to eat, and became incontinent of stool earlier in the evening. EMS was called out for illness, and found patient alert initially. Patient became unresponsive in transit, given 2115. Patient was paced, given 0.5 mg atropine for bradycardia. Patient unable to provide further history.   The history is provided by the spouse and the EMS personnel.    Past Medical History  Diagnosis Date  . Basal cell carcinoma of nose ~ 2010    points to right side of nose" (11/10/12)  . Hypertension   . Hypercholesteremia   . Heart murmur     "found out recently" (November 10, 2012)  . Type II diabetes mellitus   . Arthritis     "joints mostly" (2012/11/10)  . Kidney stone 1980's?    "passed on it's own" (11/10/12)  . 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 Type II or unspecified type diabetes mellitus without mention of complication, not stated as uncontrolled 10/28/2012   Past Surgical History  Procedure Laterality Date  . Back surgery    . Lumbar disc surgery  1985    "scraped out calcium deposits" (2012-11-10)  . Cataract extraction w/ intraocular lens implant Right 2013  . Skin cancer excision  ~ 2010    "right side of my nose" (10-Nov-2012)  . Yag laser application Right 0000000    Procedure: YAG LASER APPLICATION;  Surgeon: Williams Che, MD;  Location: [facility];  Service: Ophthalmology;  Laterality: Right;  . Colonoscopy  2003    Dr. Cristina Gong: active bleeding from pedunculated polyp, s/p polypectomy and bleeding control therapy with epi  . Colonoscopy   2003    Dr. Cristina Gong: hyperplastic polyps   . Colonoscopy  March 2014    Dr. Cristina Gong: tubular adenomas (only path report available in epic, op report not available at time of consult on 12/1.    Family History  Problem Relation Age of Onset  . Cancer Mother     sinus  . Colon cancer Neg Hx   . Liver cancer Neg Hx    History  Substance Use Topics  . Smoking status: Former Smoker -- 2 years    Quit date: 11/18/1992  . Smokeless tobacco: Never Used  . Alcohol Use: 3.6 oz/week    6 Cans of beer per week     Comment: 10-Nov-2012 "have used alot of alcohol". usually 1/5 a week.     Review of Systems  Unable to perform ROS: Intubated    Allergies  Bee venom  Home Medications   Current Outpatient Rx  Name  Route  Sig  Dispense  Refill  . amLODipine (NORVASC) 5 MG tablet   Oral   Take 1 tablet (5 mg total) by mouth daily. Resume tomorrow 10/29/12.   30 tablet   5   . aspirin 81 MG chewable tablet   Oral   Chew 81 mg by mouth daily.         Marland Kitchen atorvastatin (LIPITOR) 10 MG tablet   Oral   Take 1 tablet (10 mg total) by mouth daily.   30 tablet   5   .  Bismuth Subsalicylate (PEPTO-BISMOL PO)   Oral   Take 10 mLs by mouth daily as needed (heart burn).         . EPINEPHrine (EPI-PEN) 0.3 mg/0.3 mL SOAJ   Intramuscular   Inject 0.3 mLs (0.3 mg total) into the muscle once.   2 Device   3   . furosemide (LASIX) 40 MG tablet   Oral   Take 1 tablet (40 mg total) by mouth every morning.   30 tablet   6   . glipiZIDE (GLIPIZIDE XL) 5 MG 24 hr tablet   Oral   Take 1 tablet (5 mg total) by mouth daily.   30 tablet   5   . lisinopril (PRINIVIL,ZESTRIL) 10 MG tablet   Oral   Take 10 mg by mouth daily.         . metFORMIN (GLUCOPHAGE) 1000 MG tablet   Oral   Take 1 tablet (1,000 mg total) by mouth daily with breakfast.   90 tablet   5   . Multiple Vitamin (MULTIVITAMIN WITH MINERALS) TABS   Oral   Take 1 tablet by mouth daily.         . ONE TOUCH ULTRA  TEST test strip      USE TO CHECK BLOOD SUGAR TWICE DAILY   100 each   0   . ONETOUCH DELICA LANCETS 99991111 MISC   Does not apply   1 Stick by Does not apply route QID.   100 each   0     DX CODE 250.0   . oxyCODONE (OXY IR/ROXICODONE) 5 MG immediate release tablet   Oral   Take 1 tablet (5 mg total) by mouth every 4 (four) hours as needed for moderate pain.   100 tablet   0   . pantoprazole (PROTONIX) 40 MG tablet   Oral   Take 1 tablet (40 mg total) by mouth daily.   30 tablet   0   . ranitidine (ZANTAC) 150 MG tablet   Oral   Take 150 mg by mouth daily as needed for heartburn.         Marland Kitchen SORAfenib (NEXAVAR) 200 MG tablet      Take on an empty stomach 1 hour before or 2 hours after meals. Take one tablet twice a day or as directed.   60 tablet   3   . spironolactone (ALDACTONE) 50 MG tablet      Take one tablet twice a day, AM and 4PM daily.   60 tablet   6    BP 80/60  Pulse 45  Resp 25  SpO2 94% Physical Exam  Constitutional: He appears well-developed and well-nourished. He appears toxic. He is intubated.  HENT:  Head: Normocephalic and atraumatic.  Eyes: Pupils are equal, round, and reactive to light. Scleral icterus is present.  Neck: Normal range of motion.  Cardiovascular: Bradycardia present.   Pulses:      Radial pulses are 1+ on the right side, and 1+ on the left side.       Femoral pulses are 2+ on the right side, and 2+ on the left side. Pulmonary/Chest: Tachypnea noted. He is intubated.  Abdominal: He exhibits distension. There is no tenderness. There is no rigidity.  Genitourinary: Penis normal.  Neurological: He is unresponsive. GCS eye subscore is 1. GCS verbal subscore is 1. GCS motor subscore is 1.  Skin: Skin is warm.  Jaundiced throughout    ED Course  CENTRAL LINE Date/Time: 2013/05/24 12:10  AM Performed by: Freddi Che Authorized by: Freddi Che Consent: The procedure was performed in an emergent situation. Patient  identity confirmed: arm band Time out: Immediately prior to procedure a "time out" was called to verify the correct patient, procedure, equipment, support staff and site/side marked as required. Indications: vascular access Preparation: skin prepped with ChloraPrep Skin prep agent dried: skin prep agent completely dried prior to procedure Sterile barriers: all five maximum sterile barriers used - cap, mask, sterile gown, sterile gloves, and large sterile sheet Hand hygiene: hand hygiene performed prior to central venous catheter insertion Location details: right femoral Site selection rationale: easiest access, being actively paced Patient position: flat Catheter type: triple lumen Ultrasound guidance: yes Number of attempts: 1 Successful placement: yes Post-procedure: line sutured and dressing applied Assessment: blood return through all ports and free fluid flow Patient tolerance: Patient tolerated the procedure well with no immediate complications.   (including critical care time) Labs Review Labs Reviewed  COMPREHENSIVE METABOLIC PANEL - Abnormal; Notable for the following:    Sodium 126 (*)    Potassium >7.7 (*)    Chloride 91 (*)    CO2 10 (*)    Glucose, Bld 148 (*)    BUN 81 (*)    Creatinine, Ser 5.14 (*)    Calcium 6.7 (*)    Total Protein 4.3 (*)    Albumin 1.3 (*)    AST 739 (*)    ALT 201 (*)    Alkaline Phosphatase 358 (*)    Total Bilirubin 10.3 (*)    GFR calc non Af Amer 9 (*)    GFR calc Af Amer 11 (*)    All other components within normal limits  LACTIC ACID, PLASMA - Abnormal; Notable for the following:    Lactic Acid, Venous 8.8 (*)    All other components within normal limits  PROTIME-INR - Abnormal; Notable for the following:    Prothrombin Time 21.5 (*)    INR 1.93 (*)    All other components within normal limits  URINALYSIS, ROUTINE W REFLEX MICROSCOPIC - Abnormal; Notable for the following:    Color, Urine AMBER (*)    APPearance TURBID (*)     Bilirubin Urine LARGE (*)    Ketones, ur 15 (*)    Nitrite POSITIVE (*)    Leukocytes, UA SMALL (*)    All other components within normal limits  CBC WITH DIFFERENTIAL - Abnormal; Notable for the following:    RDW 18.4 (*)    Platelets 121 (*)    All other components within normal limits  GLUCOSE, CAPILLARY - Abnormal; Notable for the following:    Glucose-Capillary 41 (*)    All other components within normal limits  URINE MICROSCOPIC-ADD ON - Abnormal; Notable for the following:    Bacteria, UA MANY (*)    All other components within normal limits  GLUCOSE, RANDOM   Imaging Review Dg Chest Port 1 View  05/27/2013   CLINICAL DATA:  Altered mental status.  Acute respiratory failure.  EXAM: PORTABLE CHEST - 1 VIEW  COMPARISON:  04/05/2013  FINDINGS: Endotracheal tube is seen in place, with tip approximately 6 cm above the carina.  Low lung volumes are seen with mild bibasilar atelectasis. No evidence of pulmonary consolidation. Heart size is stable allowing for low lung volumes.  IMPRESSION: Endotracheal tube in satisfactory position. Low lung volumes with mild bibasilar atelectasis.   Electronically Signed   By: Earle Gell M.D.   On: 05/16/2013 23:45  EKG Interpretation   None       MDM   1. Hypotension   2. Unresponsiveness    Bradley Simmons is a 80 y.o. male who presents to the ED with unresponsiveness, bradycardia. Arrived from EMS transcutaneously paced, with low BP (80/60) initially.  Initial eval demonstrated an unresponsive male with poor BP, transcutaneous pacing, with barely palpable radial pulses, strong femorals. BSUS demonstrate heart pumping at bradycardia, though Zoll monitor was stating captured pulses. Turned up Zoll monitor, captured rhythm, but would go in and out. CVC in R femoral placed (as above). Levophed started, from 5 to 15 mg, with minimal improvement in BP. Patient became more responsive, following commands.   Patient with multiple lab  abnormalities, as above. Critically ill. Hyperkalemic. Will order insulin/dextrose, albuterol, calcium gluconate. Multisystem organ failure. Given recent diagnosis, poor prognosis at this time. ICU consulted for admission. Will transfer.   Prior to transfer, patient became unresponsive. CPR performed. Epi given. See code sheet for full info. Patient with loss of pulses. After multiple episodes of ROSC and subsequent loss of pulses, family arrived at bedside. Decided to stop CPR. Patient with ROSC shortly afterwards. Patient given medications for hyperkalemia. Started on epi gtt, bicarb gtt. Will transfer to upstairs.   Patient seen and evaluated by myself and my attending, Dr. Vanita Panda.      Freddi Che, MD 05/19/2013 0130

## 2013-05-09 NOTE — ED Notes (Signed)
Per [facility]: Pt called out for "sickness". Pt found at home on toilet, alert and talking initially. Pt became unresponsive, found to be in IVR rate 23, BP 106 palp. EMS started to pace pt; gave 0.5 atropine. At 2115 pt became unresponsive, pt intubated. Pt being currently paced at rate of 76. Pt remains unresponsive.

## 2013-05-10 DIAGNOSIS — R4182 Altered mental status, unspecified: Secondary | ICD-10-CM

## 2013-05-10 MED ORDER — SODIUM CHLORIDE 0.9 % IV SOLN
Freq: Once | INTRAVENOUS | Status: AC
  Administered 2013-05-10: 01:00:00 via INTRAVENOUS
  Filled 2013-05-10: qty 1000

## 2013-05-10 MED ORDER — SODIUM BICARBONATE 8.4 % IV SOLN
INTRAVENOUS | Status: AC
  Filled 2013-05-10: qty 50

## 2013-05-10 MED ORDER — EPINEPHRINE HCL 1 MG/ML IJ SOLN
0.5000 ug/min | INTRAVENOUS | Status: DC
  Administered 2013-05-10: 5 ug/min via INTRAVENOUS
  Filled 2013-05-10: qty 1

## 2013-05-10 MED ORDER — EPINEPHRINE HCL 0.1 MG/ML IJ SOSY
PREFILLED_SYRINGE | INTRAMUSCULAR | Status: AC | PRN
  Administered 2013-05-10 (×5): 1 via INTRAVENOUS

## 2013-05-10 MED ORDER — INSULIN ASPART 100 UNIT/ML IV SOLN
10.0000 [IU] | Freq: Once | INTRAVENOUS | Status: AC
  Administered 2013-05-10: 10 [IU] via INTRAVENOUS
  Filled 2013-05-10: qty 0.1

## 2013-05-10 MED ORDER — EPINEPHRINE HCL 1 MG/ML IJ SOLN
INTRAMUSCULAR | Status: AC
  Filled 2013-05-10: qty 1

## 2013-05-10 MED ORDER — SODIUM BICARBONATE 8.4 % IV SOLN
INTRAVENOUS | Status: AC | PRN
  Administered 2013-05-10 (×3): 50 meq via INTRAVENOUS

## 2013-05-10 MED ORDER — NOREPINEPHRINE BITARTRATE 1 MG/ML IJ SOLN
2.0000 ug/min | Freq: Once | INTRAMUSCULAR | Status: AC
  Administered 2013-05-09: 5 ug/min via INTRAVENOUS

## 2013-05-10 MED ORDER — CALCIUM CHLORIDE 10 % IV SOLN
INTRAVENOUS | Status: AC | PRN
  Administered 2013-05-10: 1 g via INTRAVENOUS

## 2013-05-10 MED ORDER — ATROPINE SULFATE 1 MG/ML IJ SOLN
INTRAMUSCULAR | Status: AC | PRN
  Administered 2013-05-10: 1 mg via INTRAVENOUS

## 2013-05-10 MED ORDER — EPINEPHRINE HCL 0.1 MG/ML IJ SOSY
PREFILLED_SYRINGE | INTRAMUSCULAR | Status: AC
  Filled 2013-05-10: qty 10

## 2013-05-11 MED FILL — Medication: Qty: 1 | Status: AC

## 2013-05-12 ENCOUNTER — Other Ambulatory Visit: Payer: Self-pay | Admitting: Nurse Practitioner

## 2013-05-12 ENCOUNTER — Other Ambulatory Visit: Payer: Self-pay | Admitting: General Practice

## 2013-05-13 ENCOUNTER — Other Ambulatory Visit (HOSPITAL_COMMUNITY): Payer: Medicare Other

## 2013-05-13 ENCOUNTER — Ambulatory Visit (HOSPITAL_COMMUNITY): Payer: Medicare Other

## 2013-05-14 NOTE — Progress Notes (Signed)
This encounter was created in error - please disregard.

## 2013-06-07 NOTE — Code Documentation (Signed)
Time of death initially called at [place].  Pt noted to be breathing and responsive at 0055.  Interventions reinitiated.

## 2013-06-07 NOTE — Progress Notes (Signed)
RT called back to room due to code being restarted.   RT bagged patient with bag valve mask until patient could be placed back on ventilator.

## 2013-06-07 NOTE — Progress Notes (Signed)
PCCM brief note: I spoke to patient's family members (wife included), updated them about patient's critical condition and very poor prognosis (multiorgan failure + cardiac arrest in the setting of advanced liver CA). They understood the gravity of the situation and that patient can code again at any moment. His wife also told me that patient had voiced in the past that he was "ready to go then the time comes". Thus, we all agreed that if patient codes again, we will let him pass away in peace. DNR status initiated.

## 2013-06-07 NOTE — ED Notes (Signed)
Levophed increased to 63mcg/min per verbal from Dr. Waymon Amato

## 2013-06-07 NOTE — Code Documentation (Signed)
Family at beside. Family given emotional support. 

## 2013-06-07 NOTE — H&P (Signed)
Name: Bradley Simmons MRN: [id number] DOB: May 31, 1929    ADMISSION DATE:  05/26/2013 CONSULTATION DATE:  05/09/2012  REFERRING MD :  ED PRIMARY SERVICE: PCCM  CHIEF COMPLAINT:  Unresponsive  BRIEF PATIENT DESCRIPTION: 80 yo M with hx of recently diagnosed with liver failure/cancer. Per the wife, patient has been decreasingly responsive during the day, refusing to eat, and became incontinent of stool earlier in the evening. EMS was called out for illness, and found patient alert initially. Patient became unresponsive in transit, given 2115. Patient was paced, given 0.5 mg atropine for bradycardia. Patient did complain of some chest pain.  SIGNIFICANT EVENTS / STUDIES:    LINES / TUBES: ET tube 05/09/2012 >>> R Femoral Central Line 05/10/12 >>>  CULTURES: None  ANTIBIOTICS: None  HISTORY OF PRESENT ILLNESS:  80 yo M with hx of recently diagnosed with liver failure/cancer. Per the wife, patient has been decreasingly responsive during the day, refusing to eat, and became incontinent of stool earlier in the evening. EMS was called out for illness, and found patient alert initially. Patient became unresponsive in transit, given 2115. Patient was paced, given 0.5 mg atropine for bradycardia. Patient did complain of some chest pain.   PAST MEDICAL HISTORY :  Past Medical History  Diagnosis Date  . Basal cell carcinoma of nose ~ 2010    points to right side of nose" (10/30/2012)  . Hypertension   . Hypercholesteremia   . Heart murmur     "found out recently" (10/30/12)  . Type II diabetes mellitus   . Arthritis     "joints mostly" (2012-10-30)  . Kidney stone 1980's?    "passed on it's own" (October 30, 2012)  . Anaphylactic reaction October 30, 2012    "stung by a mess of yellow jackets" (10/30/2012)  . Type II or unspecified type diabetes mellitus without mention of complication, not stated as uncontrolled 10/28/2012   Past Surgical History  Procedure Laterality Date  . Back surgery    . Lumbar  disc surgery  1985    "scraped out calcium deposits" (2012-10-30)  . Cataract extraction w/ intraocular lens implant Right 2013  . Skin cancer excision  ~ 2010    "right side of my nose" (10/30/2012)  . Yag laser application Right 0000000    Procedure: YAG LASER APPLICATION;  Surgeon: Williams Che, MD;  Location: [facility];  Service: Ophthalmology;  Laterality: Right;  . Colonoscopy  2003    Dr. Cristina Gong: active bleeding from pedunculated polyp, s/p polypectomy and bleeding control therapy with epi  . Colonoscopy  2003    Dr. Cristina Gong: hyperplastic polyps   . Colonoscopy  March 2014    Dr. Cristina Gong: tubular adenomas (only path report available in epic, op report not available at time of consult on 12/1.    Prior to Admission medications   Medication Sig Start Date End Date Taking? Authorizing Provider  amLODipine (NORVASC) 5 MG tablet Take 1 tablet (5 mg total) by mouth daily. Resume tomorrow 10/29/12. 11/27/12   Erby Pian, FNP  aspirin 81 MG chewable tablet Chew 81 mg by mouth daily.    Historical Provider, MD  atorvastatin (LIPITOR) 10 MG tablet Take 1 tablet (10 mg total) by mouth daily. 11/18/12   Mary-Margaret Hassell Done, FNP  Bismuth Subsalicylate (PEPTO-BISMOL PO) Take 10 mLs by mouth daily as needed (heart burn).    Historical Provider, MD  EPINEPHrine (EPI-PEN) 0.3 mg/0.3 mL SOAJ Inject 0.3 mLs (0.3 mg total) into the muscle once. 10/30/12  Mary-Margaret Hassell Done, FNP  furosemide (LASIX) 40 MG tablet Take 1 tablet (40 mg total) by mouth every morning. 04/21/13   Farrel Gobble, MD  glipiZIDE (GLIPIZIDE XL) 5 MG 24 hr tablet Take 1 tablet (5 mg total) by mouth daily. 11/18/12   Mary-Margaret Hassell Done, FNP  lisinopril (PRINIVIL,ZESTRIL) 10 MG tablet Take 10 mg by mouth daily.    Historical Provider, MD  metFORMIN (GLUCOPHAGE) 1000 MG tablet Take 1 tablet (1,000 mg total) by mouth daily with breakfast. 11/18/12   Mary-Margaret Hassell Done, FNP  Multiple Vitamin (MULTIVITAMIN WITH MINERALS) TABS  Take 1 tablet by mouth daily.    Historical Provider, MD  ONE TOUCH ULTRA TEST test strip USE TO CHECK BLOOD SUGAR TWICE DAILY 02/19/13   Chipper Herb, MD  [facility] DELICA LANCETS 99M MISC 1 Stick by Does not apply route QID. 03/04/13   Mary-Margaret Hassell Done, FNP  oxyCODONE (OXY IR/ROXICODONE) 5 MG immediate release tablet Take 1 tablet (5 mg total) by mouth every 4 (four) hours as needed for moderate pain. 04/21/13   Farrel Gobble, MD  pantoprazole (PROTONIX) 40 MG tablet Take 1 tablet (40 mg total) by mouth daily. 04/07/13   Radene Gunning, NP  ranitidine (ZANTAC) 150 MG tablet Take 150 mg by mouth daily as needed for heartburn.    Historical Provider, MD  SORAfenib (NEXAVAR) 200 MG tablet Take on an empty stomach 1 hour before or 2 hours after meals. Take one tablet twice a day or as directed. 04/21/13   Farrel Gobble, MD  spironolactone (ALDACTONE) 50 MG tablet Take one tablet twice a day, AM and 4PM daily. 04/21/13   Farrel Gobble, MD   Allergies  Allergen Reactions  . Bee Venom Anaphylaxis and Swelling    FAMILY HISTORY:  Family History  Problem Relation Age of Onset  . Cancer Mother     sinus  . Colon cancer Neg Hx   . Liver cancer Neg Hx    SOCIAL HISTORY:  reports that he quit smoking about 20 years ago. He has never used smokeless tobacco. He reports that he drinks about 3.6 ounces of alcohol per week. He reports that he does not use illicit drugs.  REVIEW OF SYSTEMS:  Had some chest pain earlier but currently unable to give history due to altered mental status  SUBJECTIVE: 80 yo M with hx of recently diagnosed with liver failure/cancer. Per the wife, patient has been decreasingly responsive during the day, refusing to eat, and became incontinent of stool earlier in the evening. EMS was called out for illness, and found patient alert initially. Patient became unresponsive in transit, given 2115. Patient was paced, given 0.5 mg atropine for bradycardia. Patient did  complain of some chest pain.  VITAL SIGNS: Pulse Rate:  [45-76] 45 (01/03 2131) Resp:  [24-25] 25 (01/03 2131) BP: (80)/(60) 80/60 mmHg (01/03 0925) SpO2:  [93 %-94 %] 94 % (01/03 2131) FiO2 (%):  [100 %] 100 % (01/03 2131) HEMODYNAMICS:   VENTILATOR SETTINGS: Vent Mode:  [-] PRVC FiO2 (%):  [100 %] 100 % Set Rate:  [14 bmp] 14 bmp Vt Set:  [520 mL] 520 mL PEEP:  [5 cmH20] 5 cmH20 Plateau Pressure:  [15 cmH20] 15 cmH20 INTAKE / OUTPUT: Intake/Output   None     PHYSICAL EXAMINATION: General:  Intubated, pupils fixed and dilated Neuro:  Completely unresponsive HEENT:  Atraumatic, ET tube in place, scleral jaundice Cardiovascular:  No heart sounds, no pulse Lungs:  No spontaneous breath sounds Abdomen:  Soft, no  guarding Musculoskeletal:  Has bilateral leg edema Skin:  Has jaundice  LABS:  CBC  Recent Labs Lab 05-27-13 2250  WBC 6.2  HGB 14.2  HCT 41.2  PLT 121*   Coag's  Recent Labs Lab 2013-05-27 2250  INR 1.93*   BMET  Recent Labs Lab 27-May-2013 2250  NA 126*  K >7.7*  CL 91*  CO2 10*  BUN 81*  CREATININE 5.14*  GLUCOSE 148*   Electrolytes  Recent Labs Lab 2013/05/27 2250  CALCIUM 6.7*   Sepsis Markers  Recent Labs Lab 27-May-2013 2146  LATICACIDVEN 8.8*   ABG No results found for this basename: PHART, PCO2ART, PO2ART,  in the last 168 hours Liver Enzymes  Recent Labs Lab May 27, 2013 2250  AST 739*  ALT 201*  ALKPHOS 358*  BILITOT 10.3*  ALBUMIN 1.3*   Cardiac Enzymes No results found for this basename: TROPONINI, PROBNP,  in the last 168 hours Glucose  Recent Labs Lab May 27, 2013 2145  GLUCAP 41*    Imaging Dg Chest Port 1 View  May 27, 2013   CLINICAL DATA:  Altered mental status.  Acute respiratory failure.  EXAM: PORTABLE CHEST - 1 VIEW  COMPARISON:  04/05/2013  FINDINGS: Endotracheal tube is seen in place, with tip approximately 6 cm above the carina.  Low lung volumes are seen with mild bibasilar atelectasis. No evidence of  pulmonary consolidation. Heart size is stable allowing for low lung volumes.  IMPRESSION: Endotracheal tube in satisfactory position. Low lung volumes with mild bibasilar atelectasis.   Electronically Signed   By: Earle Gell M.D.   On: 05/27/2013 23:45     CXR: Endotracheal tube in satisfactory position. Low lung volumes with mild bibasilar atelectasis  ASSESSMENT / PLAN: Patient intubated, on levo drip. Later on he had cardiac arrest x 3. Potassium came back 7.7, bicarb 10, lactic 8.8. Initially CPR started on each arrest, got several rounds of epi shots, bicarbs, and dextrose. He was able to have ROSC but after discussion with his family members, they understood that his prognosis is grim, especially with advanced liver CA and multiorgan failure. Thus he was made DNR. Bicarb drip and epi drip ordered. Despite all these interventions, later on he passed away with his family at bedside. ED attending and resident also bedside and aware and we are all in agreement.  I have personally obtained a history, examined the patient, evaluated laboratory and imaging results, formulated the assessment and plan and placed orders. CRITICAL CARE: The patient is critically ill with multiple organ systems failure and requires high complexity decision making for assessment and support, frequent evaluation and titration of therapies, application of advanced monitoring technologies and extensive interpretation of multiple databases. Critical Care Time devoted to patient care services described in this note is 55 minutes.    Pulmonary and [place] Pager: [phone]  05/16/2013, 1:10 AM

## 2013-06-07 NOTE — ED Provider Notes (Addendum)
This patient was seen in conjunction with the resident physician, Dr. Silvio Clayman.  The documentation is accurate and reflects the patient's evaluation.  On my exam the patient was in extremis.  Initial attempts to obtain history were limited by the patient's mental state.  With the description of recently diagnosed [place], there was concern for progression of disease versus other acute processes. He was jaundiced, edematous, minimally active. The patient was started on trans-cutaneous pacing by EMS.   This seemed to have intermittent capture, but the patient did recover enough consistent / spontaneous cardiac activity that he was intermittently responding. Patient's labs were consistent with acute renal failure, hepatic dysfunction and hyperkalemia. The patient had one brief period of CPR early in the course, and after several hours he again lost pulses and required additional resuscitation, and starting of epi and bicarb drips. The patient did develop sufficient capacity to interact minimally, but his course throughout his ED stay was generally declining. After the second sustained / prolonged resuscitation, he was made DNR by family.  (On chart review) the patient died later that day.  I saw the initial ECG (and agree with the interpretation) as well as the rhythm strips by EMS.  ECG here had ventricular rhythm w HR 40 - grossly abnormal  I was present for the central line procedure and assisted directly with it.  Cardiopulmonary Resuscitation (CPR) Procedure Note Directed/Performed by: Carmin Muskrat I personally directed ancillary staff and/or performed CPR in an effort to regain return of spontaneous circulation and to maintain cardiac, neuro and systemic perfusion.    CRITICAL CARE Performed by: Carmin Muskrat Total critical care time: 60 Critical care time was exclusive of separately billable procedures and treating other patients. Critical care was necessary to treat or prevent  imminent or life-threatening deterioration. Critical care was time spent personally by me on the following activities: development of treatment plan with patient and/or surrogate as well as nursing, discussions with consultants, evaluation of patient's response to treatment, examination of patient, obtaining history from patient or surrogate, ordering and performing treatments and interventions, ordering and review of laboratory studies, ordering and review of radiographic studies, pulse oximetry and re-evaluation of patient's condition.    Carmin Muskrat, MD 24-May-2013 [id number]  Carmin Muskrat, MD 05/19/13 1538

## 2013-06-07 NOTE — Code Documentation (Addendum)
Patient time of death occurred at 61.  Pronounced by Dr. Emmaline Life. Family at bedside.

## 2013-06-07 NOTE — Progress Notes (Signed)
Chaplain was on the scene when pt coded.  Chaplain promoted communication sharing between medical team and family.  Pt did regain pulse and chaplain escorted family to be with pt.  Family had their pastor present.  Chaplain thought it better to have the spiritual support and emotional comfort of their own faith resource present.

## 2013-06-07 NOTE — Code Documentation (Addendum)
Family at beside. Family given emotional support.  Family agreed to make pt DNR.  No more resuscitative efforts after initiation of epi drip and bicarb drip.

## 2013-06-07 NOTE — ED Notes (Signed)
Family at bedside.  No pulses present.

## 2013-06-07 NOTE — Progress Notes (Signed)
Death Pronouncement  I was called to patient's bedside to pronounce that patient has died.  No spontaneous movements were present. There was not response to verbal or tactile stimuli. Pupils were mid-dilated and fixed. No breath sounds were appreciated over either lung field. No carotid pulses were palpable. No heart sounds were auscultator over entire precordium. Patient pronounced dead on 05/14/2022 at 1:35AM in the ED. Family were notified. The family declines autopsy. Chaplain and postmortem services offered.  Pt was DNR. Patient's major medical illness was Liver cancer, hyperkalemia, metabolic acidosis, leading to cardiac arrest.  Time of death was 1:35 AM, confirmed and witnessed by ED Nurse.

## 2013-06-07 DEATH — deceased

## 2014-11-07 IMAGING — CR DG CHEST 1V PORT
1 series · 1 of 1 positions shown · non-contrast
Comparison: 12/05/2007

CLINICAL DATA: Syncope/loss of consciousness.

PORTABLE CHEST - 1 VIEW

[AP]
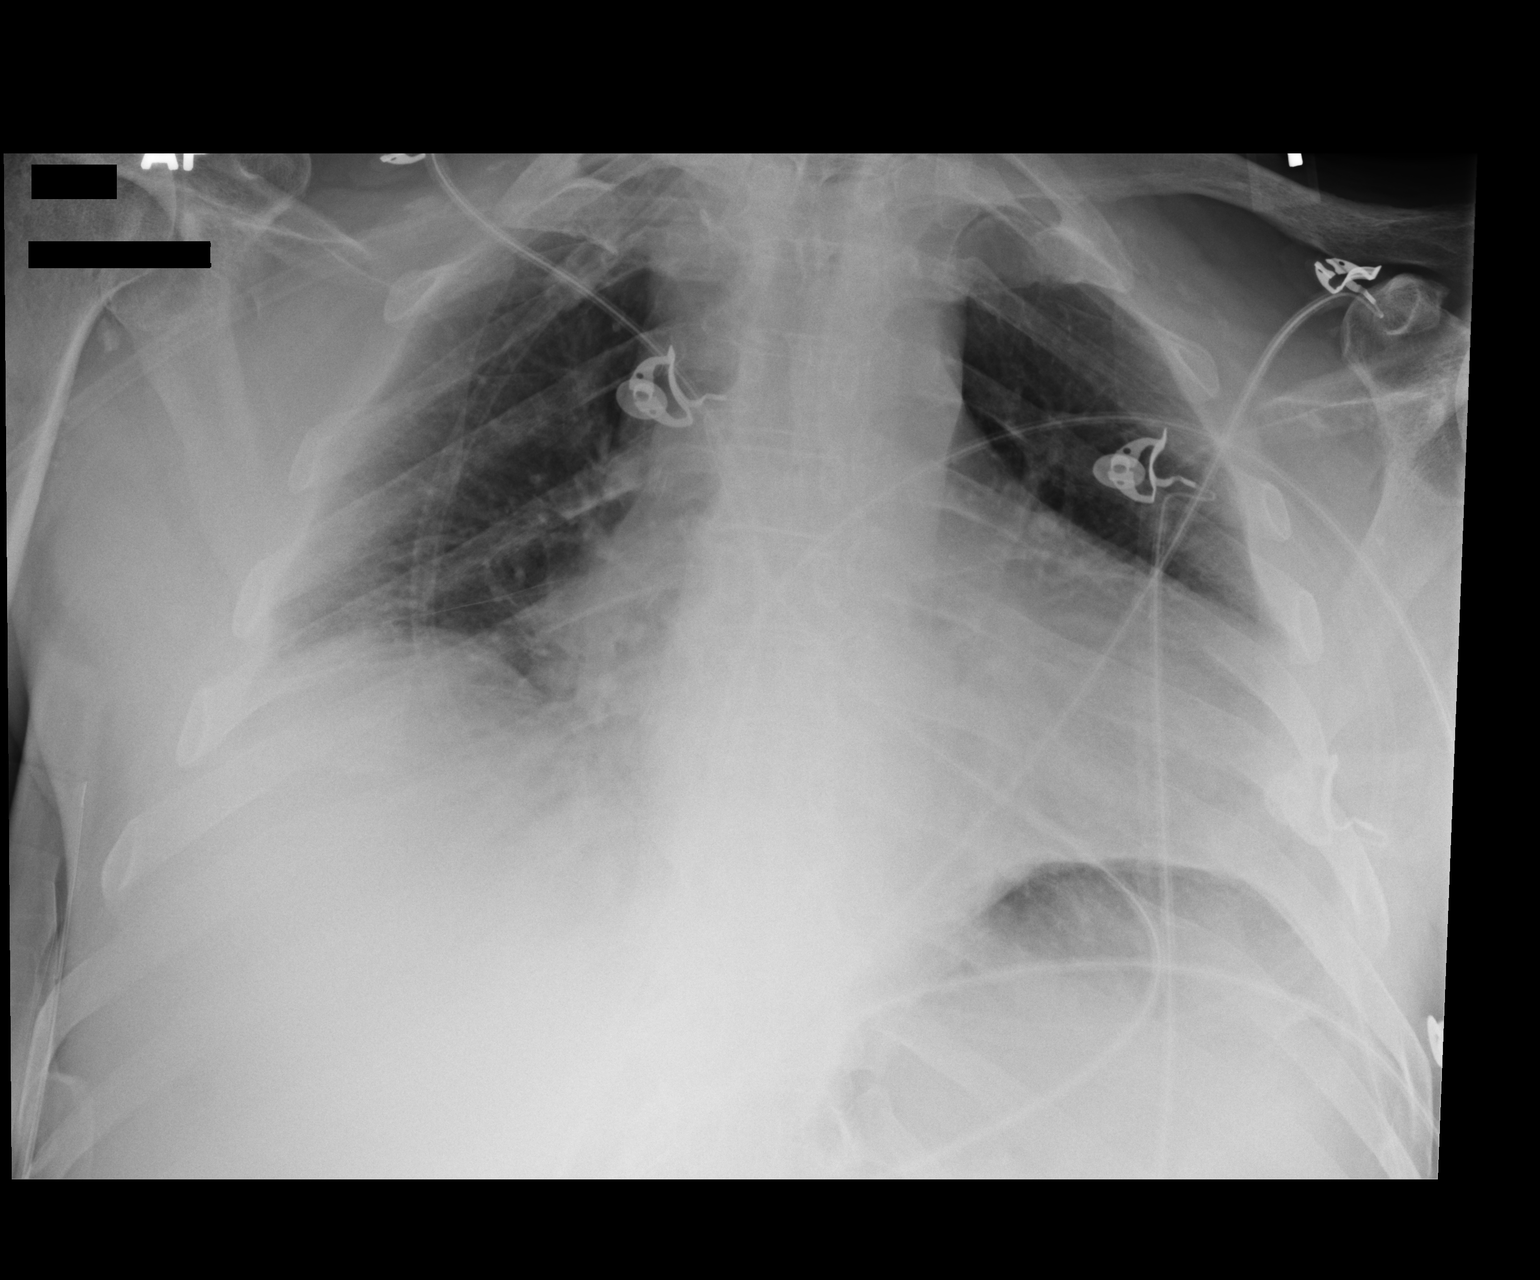

[1 of 1 positions shown; findings below may reference images not displayed]

FINDINGS: Artifact overlies chest.  The heart size is normal
allowing for technical factors.  Mediastinal shadows are normal.
Lungs are clear.  No effusions.  No bony abnormalities.
IMPRESSION: No active disease

## 2015-04-16 IMAGING — CR DG CHEST 2V
2 series · 2 of 2 positions shown · non-contrast
Comparison: Chest radiograph 10/27/2012 and 12/05/2007

CLINICAL DATA: Abdominal pain

EXAM:
CHEST  2 VIEW

[view not recorded (1 of 2)]
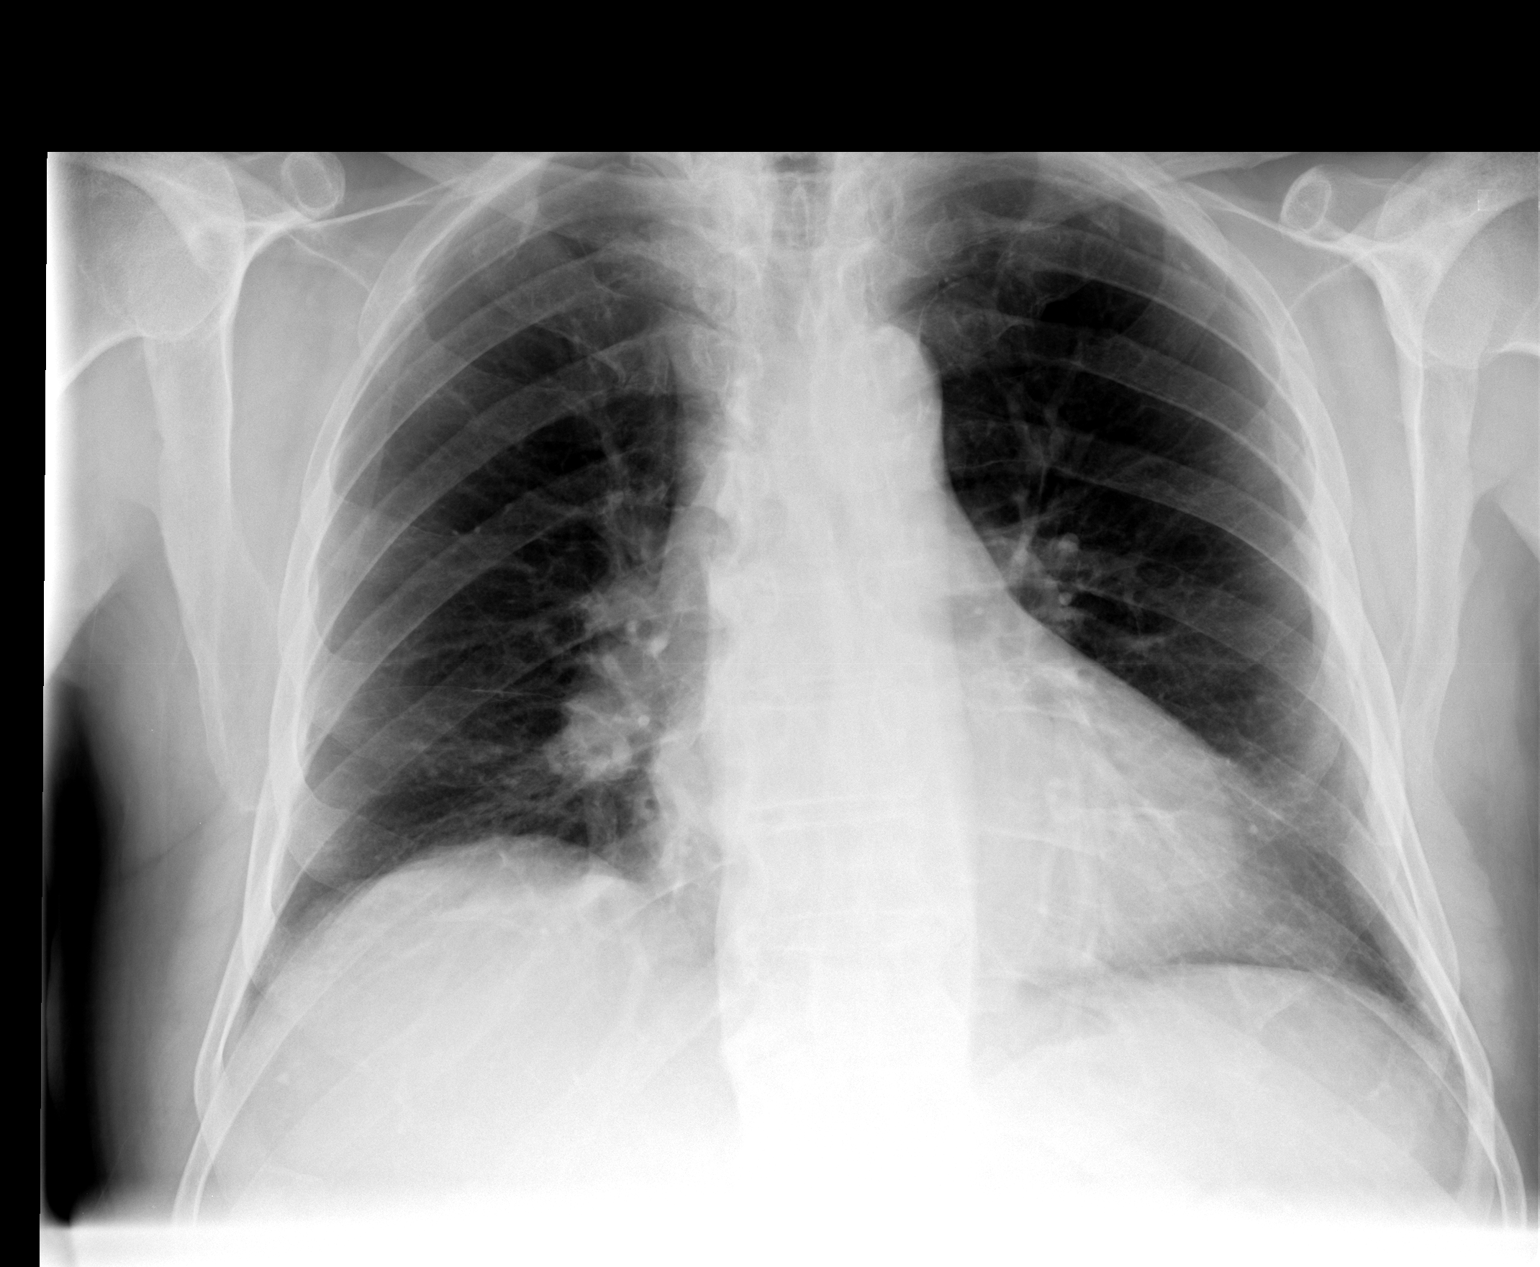

[view not recorded (2 of 2)]
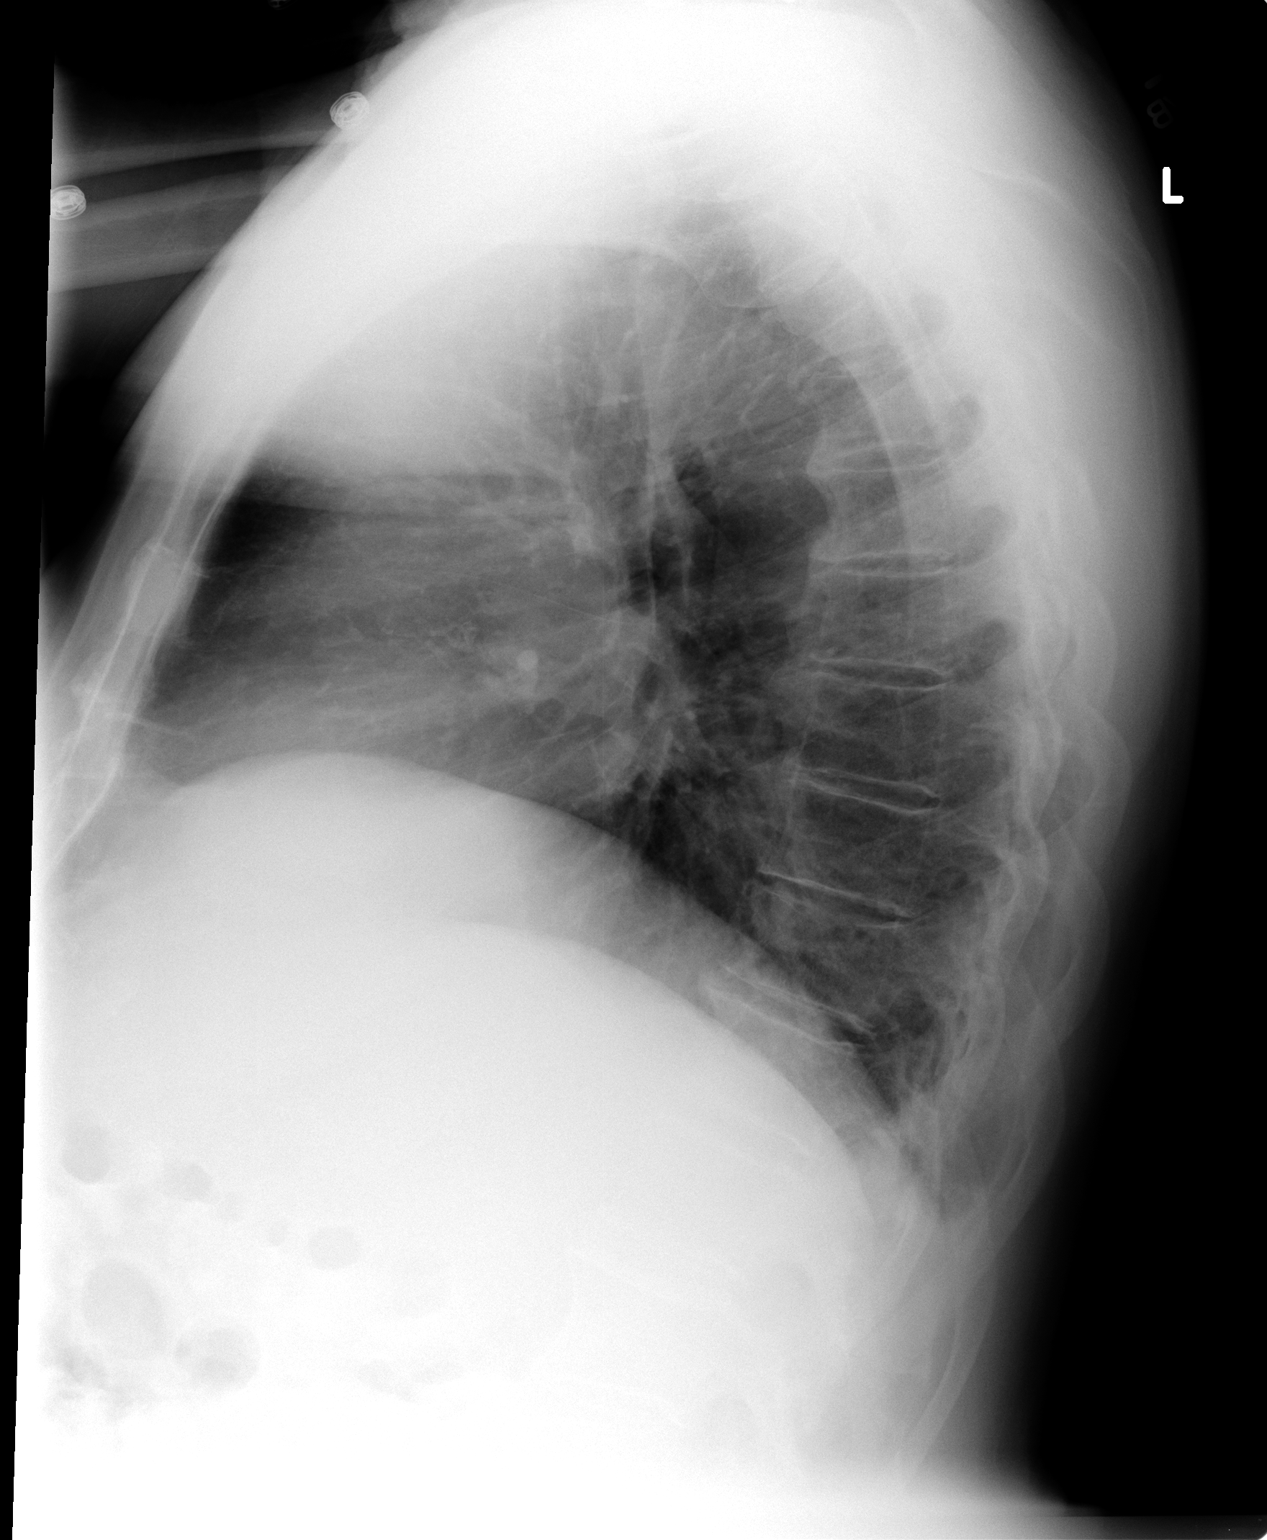

[2 of 2 positions shown; findings below may reference images not displayed]

FINDINGS: Mild cardiomegaly. Normal pulmonary vascularity mediastinal
contours. Atherosclerotic calcification of the thoracic aortic arch.
No airspace disease. No visible pleural effusion or pneumothorax. No
acute osseous abnormality.
IMPRESSION: Mild cardiomegaly.  No acute findings.

## 2015-04-18 IMAGING — US US ABDOMEN LIMITED
1 series · 3 of 3 positions shown · non-contrast
Comparison: 04/06/2013

CLINICAL DATA: Cirrhotic liver with hepatic mass question hepatoma,
ascites, assess for paracentesis

EXAM:
LIMITED ABDOMEN ULTRASOUND FOR ASCITES
TECHNIQUE: Limited ultrasound survey for ascites was performed in all four
abdominal quadrants.

[Series 1: us abdomen limited · 0.18mm/px · 3 of 3 slices shown]
[im 1/3]
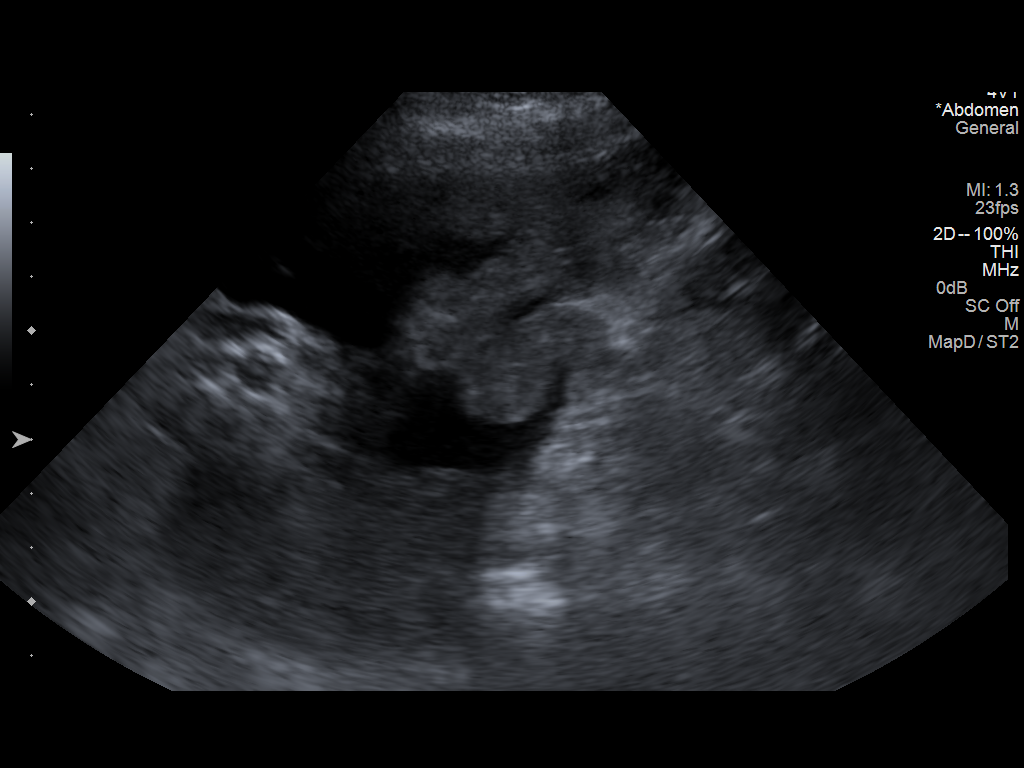
[im 2/3]
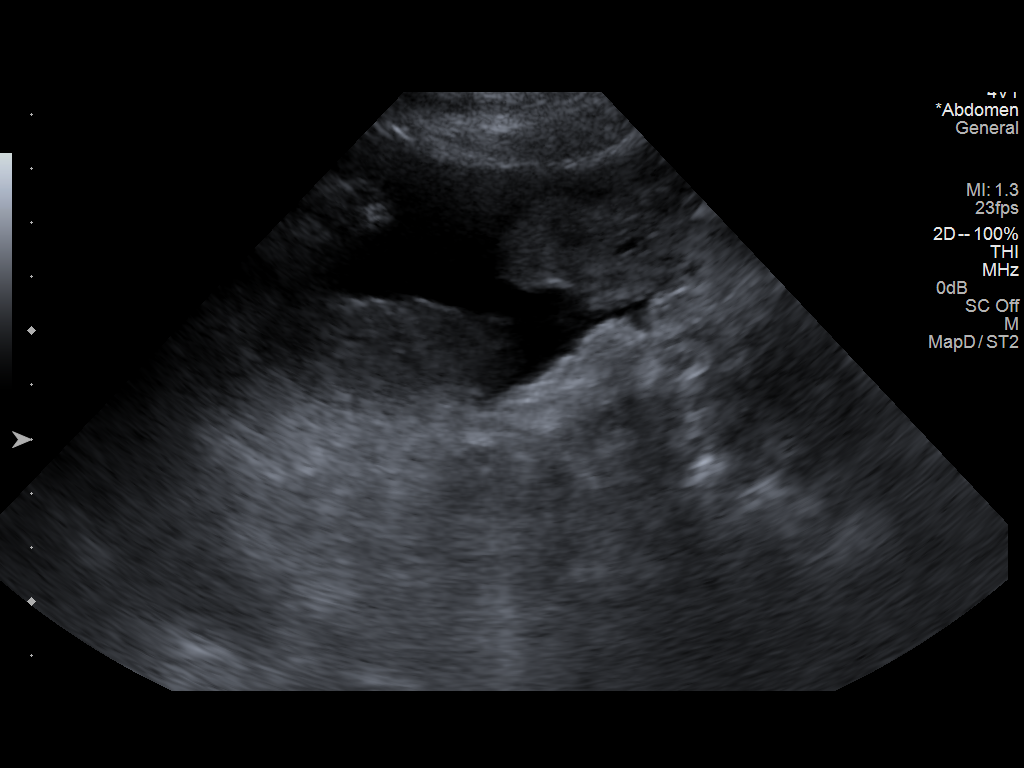
[im 3/3]
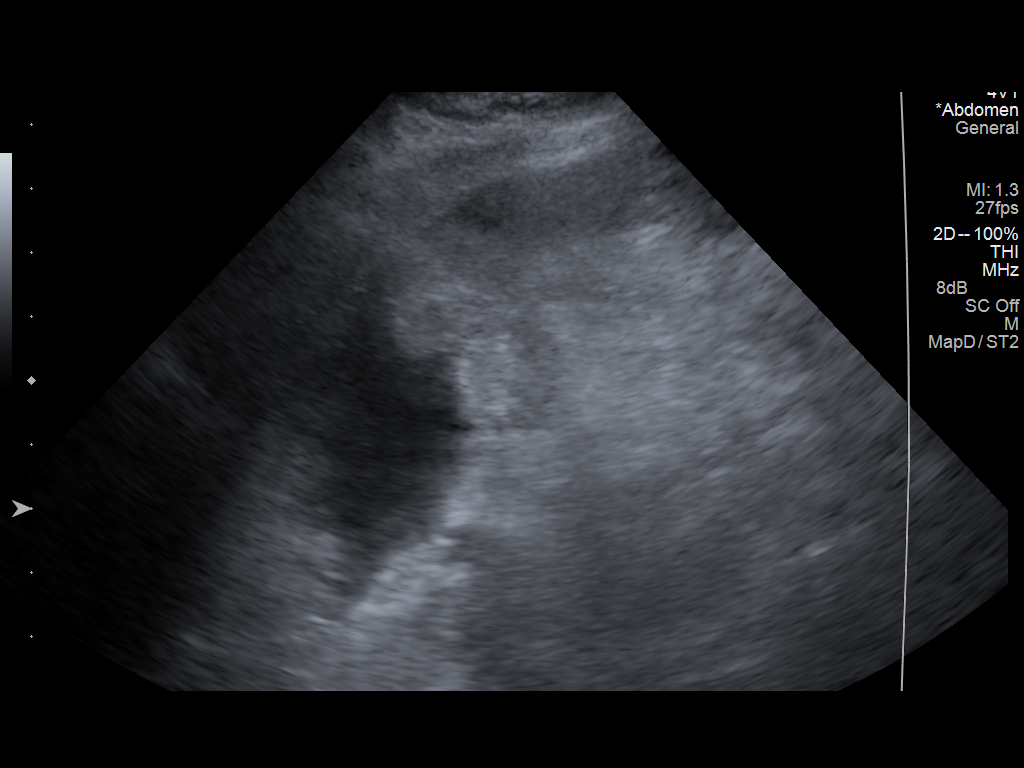

[3 of 3 positions shown; findings below may reference images not displayed]

FINDINGS: Small amount of ascites is seen at the right pericolic gutter/right
lower quadrant.

Numerous bowel loops are present within this small collection of
ascites.
Amount of ascites is insufficient for paracentesis with no adequate
pocket identified to safely allow for the performance of the
procedure.
IMPRESSION: Small amount of ascites in the right lower quadrant, insufficient
for paracentesis.
